# Patient Record
Sex: Female | Born: 1985 | Race: Black or African American | Hispanic: No | Marital: Single | Smoking: Current some day smoker
Health system: Southern US, Community
[De-identification: ages and names within clinical notes are randomized; demographics above are authoritative.]

## PROBLEM LIST (undated history)

## (undated) DIAGNOSIS — E079 Disorder of thyroid, unspecified: Secondary | ICD-10-CM

## (undated) HISTORY — PX: INTRAUTERINE DEVICE INSERTION: SHX323

---
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
## 2003-02-28 ENCOUNTER — Emergency Department (HOSPITAL_COMMUNITY): Admission: EM | Admit: 2003-02-28 | Discharge: 2003-02-28 | Payer: Self-pay

## 2003-04-17 ENCOUNTER — Other Ambulatory Visit: Admission: RE | Admit: 2003-04-17 | Discharge: 2003-04-17 | Payer: Self-pay | Admitting: Family Medicine

## 2006-07-02 ENCOUNTER — Encounter (INDEPENDENT_AMBULATORY_CARE_PROVIDER_SITE_OTHER): Payer: Self-pay | Admitting: *Deleted

## 2007-04-19 ENCOUNTER — Emergency Department (HOSPITAL_COMMUNITY): Admission: EM | Admit: 2007-04-19 | Discharge: 2007-04-19 | Payer: Self-pay | Admitting: *Deleted

## 2007-04-21 ENCOUNTER — Inpatient Hospital Stay (HOSPITAL_COMMUNITY): Admission: AD | Admit: 2007-04-21 | Discharge: 2007-04-21 | Payer: Self-pay | Admitting: Obstetrics & Gynecology

## 2007-06-06 ENCOUNTER — Inpatient Hospital Stay (HOSPITAL_COMMUNITY): Admission: AD | Admit: 2007-06-06 | Discharge: 2007-06-06 | Payer: Self-pay | Admitting: Obstetrics and Gynecology

## 2007-07-12 ENCOUNTER — Ambulatory Visit (HOSPITAL_COMMUNITY): Admission: RE | Admit: 2007-07-12 | Discharge: 2007-07-12 | Payer: Self-pay | Admitting: Obstetrics & Gynecology

## 2007-12-07 ENCOUNTER — Ambulatory Visit (HOSPITAL_COMMUNITY): Admission: RE | Admit: 2007-12-07 | Discharge: 2007-12-07 | Payer: Self-pay | Admitting: Family Medicine

## 2007-12-13 ENCOUNTER — Inpatient Hospital Stay (HOSPITAL_COMMUNITY): Admission: AD | Admit: 2007-12-13 | Discharge: 2007-12-13 | Payer: Self-pay | Admitting: Obstetrics & Gynecology

## 2007-12-13 ENCOUNTER — Ambulatory Visit: Payer: Self-pay | Admitting: Obstetrics & Gynecology

## 2007-12-14 ENCOUNTER — Ambulatory Visit: Payer: Self-pay | Admitting: Family Medicine

## 2007-12-14 ENCOUNTER — Inpatient Hospital Stay (HOSPITAL_COMMUNITY): Admission: AD | Admit: 2007-12-14 | Discharge: 2007-12-16 | Payer: Self-pay | Admitting: Gynecology

## 2007-12-14 ENCOUNTER — Inpatient Hospital Stay (HOSPITAL_COMMUNITY): Admission: AD | Admit: 2007-12-14 | Discharge: 2007-12-14 | Payer: Self-pay | Admitting: Family Medicine

## 2009-05-21 ENCOUNTER — Emergency Department (HOSPITAL_COMMUNITY): Admission: EM | Admit: 2009-05-21 | Discharge: 2009-05-21 | Payer: Self-pay | Admitting: Emergency Medicine

## 2009-06-05 ENCOUNTER — Emergency Department (HOSPITAL_COMMUNITY): Admission: EM | Admit: 2009-06-05 | Discharge: 2009-06-05 | Payer: Self-pay | Admitting: Emergency Medicine

## 2009-06-20 ENCOUNTER — Emergency Department (HOSPITAL_COMMUNITY): Admission: EM | Admit: 2009-06-20 | Discharge: 2009-06-20 | Payer: Self-pay | Admitting: Emergency Medicine

## 2009-08-14 ENCOUNTER — Emergency Department (HOSPITAL_COMMUNITY): Admission: EM | Admit: 2009-08-14 | Discharge: 2009-08-14 | Payer: Self-pay | Admitting: Emergency Medicine

## 2010-10-13 ENCOUNTER — Inpatient Hospital Stay (INDEPENDENT_AMBULATORY_CARE_PROVIDER_SITE_OTHER)
Admission: RE | Admit: 2010-10-13 | Discharge: 2010-10-13 | Disposition: A | Discharge status: Home / Self Care | Payer: Self-pay | Source: Ambulatory Visit | Attending: Emergency Medicine | Admitting: Emergency Medicine

## 2010-10-13 DIAGNOSIS — S058X9A Other injuries of unspecified eye and orbit, initial encounter: Secondary | ICD-10-CM

## 2010-10-13 DIAGNOSIS — H109 Unspecified conjunctivitis: Secondary | ICD-10-CM

## 2010-10-20 ENCOUNTER — Inpatient Hospital Stay (INDEPENDENT_AMBULATORY_CARE_PROVIDER_SITE_OTHER)
Admission: RE | Admit: 2010-10-20 | Discharge: 2010-10-20 | Disposition: A | Discharge status: Home / Self Care | Payer: Self-pay | Source: Ambulatory Visit | Attending: Family Medicine | Admitting: Family Medicine

## 2010-10-20 DIAGNOSIS — H109 Unspecified conjunctivitis: Secondary | ICD-10-CM

## 2011-01-23 LAB — URINALYSIS, ROUTINE W REFLEX MICROSCOPIC
Bilirubin Urine: NEGATIVE
Glucose, UA: NEGATIVE
Hgb urine dipstick: NEGATIVE
Ketones, ur: NEGATIVE
Nitrite: NEGATIVE
Urobilinogen, UA: 0.2
pH: 5.5

## 2011-01-23 LAB — POCT PREGNANCY, URINE
Operator id: 280921
Preg Test, Ur: POSITIVE

## 2011-01-30 LAB — WET PREP, GENITAL
Clue Cells Wet Prep HPF POC: NONE SEEN
Trich, Wet Prep: NONE SEEN
Yeast Wet Prep HPF POC: NONE SEEN

## 2011-02-06 LAB — WET PREP, GENITAL
WBC, Wet Prep HPF POC: NONE SEEN
Yeast Wet Prep HPF POC: NONE SEEN

## 2011-02-06 LAB — URINALYSIS, ROUTINE W REFLEX MICROSCOPIC
Bilirubin Urine: NEGATIVE
Glucose, UA: NEGATIVE
Glucose, UA: NEGATIVE
Hgb urine dipstick: NEGATIVE
Ketones, ur: NEGATIVE
Protein, ur: NEGATIVE
Specific Gravity, Urine: 1.02
Urobilinogen, UA: 2 — ABNORMAL HIGH

## 2011-02-06 LAB — HCG, QUANTITATIVE, PREGNANCY: hCG, Beta Chain, Quant, S: 29779 — ABNORMAL HIGH

## 2011-04-18 ENCOUNTER — Encounter: Payer: Self-pay | Admitting: *Deleted

## 2011-04-18 ENCOUNTER — Emergency Department (INDEPENDENT_AMBULATORY_CARE_PROVIDER_SITE_OTHER)
Admission: EM | Admit: 2011-04-18 | Discharge: 2011-04-18 | Disposition: A | Discharge status: Home / Self Care | Payer: Self-pay | Source: Home / Self Care | Attending: Family Medicine | Admitting: Family Medicine

## 2011-04-18 DIAGNOSIS — J019 Acute sinusitis, unspecified: Secondary | ICD-10-CM

## 2011-04-18 DIAGNOSIS — H05019 Cellulitis of unspecified orbit: Secondary | ICD-10-CM

## 2011-04-18 DIAGNOSIS — H05012 Cellulitis of left orbit: Secondary | ICD-10-CM

## 2011-04-18 MED ORDER — LIDOCAINE HCL (PF) 1 % IJ SOLN
INTRAMUSCULAR | Status: AC
  Filled 2011-04-18: qty 5

## 2011-04-18 MED ORDER — FEXOFENADINE-PSEUDOEPHED ER 180-240 MG PO TB24: 1.0000 | ORAL_TABLET | Freq: Every day | ORAL | Status: AC

## 2011-04-18 MED ORDER — AMOXICILLIN-POT CLAVULANATE 875-125 MG PO TABS: 1.0000 | ORAL_TABLET | Freq: Two times a day (BID) | ORAL | Status: AC

## 2011-04-18 MED ORDER — CEFTRIAXONE SODIUM 1 G IJ SOLR
INTRAMUSCULAR | Status: AC
  Filled 2011-04-18: qty 10

## 2011-04-18 MED ORDER — CEFTRIAXONE SODIUM 1 G IJ SOLR
1.0000 g | Freq: Once | INTRAMUSCULAR | Status: AC
  Administered 2011-04-18: 1 g via INTRAMUSCULAR

## 2011-04-18 NOTE — ED Notes (Addendum)
Pt with onset of Left eye irritated and itching onset upon awakening this morning - left facial swelling and headache onset x several hours face tender to palpation

## 2011-04-18 NOTE — ED Provider Notes (Signed)
History     CSN: [id number] Arrival date & time: 04/18/2011  4:46 PM   First MD Initiated Contact with Patient 04/18/11 1544      Chief Complaint  Patient presents with  . Headache  . Eye Problem  . Facial Swelling   Patient reports swelling of the left thigh irritation this morning since then she's no swelling underneath her left eye and on the side of her face. Noticing that she's never had anything like this before he denies any foreign object or sensation of foreign object in her left eye  (Consider location/radiation/quality/duration/timing/severity/associated sxs/prior treatment) Patient is a 25 y.o. female presenting with headaches and eye problem. The history is provided by the patient.  Headache The primary symptoms include headaches and altered mental status. Primary symptoms do not include loss of consciousness, fever or vomiting. The symptoms began 6 to 12 hours ago. The symptoms are worsening.  The headache is not associated with photophobia.  Additional symptoms do not include photophobia.  Eye Problem  The current episode started 6 to 12 hours ago. There is pain in the left eye. Associated symptoms include numbness, eye redness and tingling. Pertinent negatives include no discharge, no foreign body sensation, no photophobia and no vomiting. She has tried nothing for the symptoms.    History reviewed. No pertinent past medical history.  Past Surgical History  Procedure Date  . Intrauterine device insertion     History reviewed. No pertinent family history.  History  Substance Use Topics  . Smoking status: Current Everyday Smoker  . Smokeless tobacco: Not on file  . Alcohol Use: Yes    OB History    Grav Para Term Preterm Abortions TAB SAB Ect Mult Living                  Review of Systems  Constitutional: Negative for fever.  Eyes: Positive for redness. Negative for photophobia and discharge.  Gastrointestinal: Negative for vomiting.  Neurological:  Positive for tingling, numbness and headaches. Negative for loss of consciousness.  Psychiatric/Behavioral: Positive for altered mental status.  All other systems reviewed and are negative.    Allergies  Review of patient's allergies indicates no known allergies.  Home Medications  No current outpatient prescriptions on file.  BP 113/75  Pulse 74  Temp(Src) 99.2 F (37.3 C) (Oral)  Resp 16  SpO2 100%  LMP 03/19/2011  Physical Exam  Constitutional: She is oriented to person, place, and time. She appears well-nourished.  HENT:  Head: Normocephalic.  Right Ear: External ear normal.  Left Ear: External ear normal.       Tenderness over both maxillary sinuses  Swelling under the L eye Mild conjunctivae irritation  Neck: Normal range of motion. Neck supple. No tracheal deviation present. No thyromegaly present.  Neurological: She is alert and oriented to person, place, and time.  Skin: Skin is warm.    ED Course  Procedures (including critical care time)  Labs Reviewed - No data to display No results found.   No diagnosis found.    MDM          Hassan Rowan, MD 04/18/11 2136

## 2012-08-20 ENCOUNTER — Emergency Department (HOSPITAL_COMMUNITY)
Admission: EM | Admit: 2012-08-20 | Discharge: 2012-08-20 | Disposition: A | Discharge status: Home / Self Care | Payer: Self-pay | Attending: Emergency Medicine | Admitting: Emergency Medicine

## 2012-08-20 ENCOUNTER — Encounter (HOSPITAL_COMMUNITY): Payer: Self-pay

## 2012-08-20 DIAGNOSIS — F17200 Nicotine dependence, unspecified, uncomplicated: Secondary | ICD-10-CM | POA: Insufficient documentation

## 2012-08-20 DIAGNOSIS — R109 Unspecified abdominal pain: Secondary | ICD-10-CM | POA: Insufficient documentation

## 2012-08-20 DIAGNOSIS — R1110 Vomiting, unspecified: Secondary | ICD-10-CM | POA: Insufficient documentation

## 2012-08-20 DIAGNOSIS — R32 Unspecified urinary incontinence: Secondary | ICD-10-CM | POA: Insufficient documentation

## 2012-08-20 DIAGNOSIS — R350 Frequency of micturition: Secondary | ICD-10-CM | POA: Insufficient documentation

## 2012-08-20 DIAGNOSIS — R6883 Chills (without fever): Secondary | ICD-10-CM | POA: Insufficient documentation

## 2012-08-20 HISTORY — DX: Disorder of thyroid, unspecified: E07.9

## 2012-08-20 LAB — COMPREHENSIVE METABOLIC PANEL
ALT: 10 U/L (ref 0–35)
AST: 16 U/L (ref 0–37)
Albumin: 4.1 g/dL (ref 3.5–5.2)
CO2: 25 mEq/L (ref 19–32)
Calcium: 9.7 mg/dL (ref 8.4–10.5)
Creatinine, Ser: 0.68 mg/dL (ref 0.50–1.10)
GFR calc non Af Amer: >90 mL/min (ref >90)
Sodium: 139 mEq/L (ref 135–145)
Total Protein: 7.6 g/dL (ref 6.0–8.3)

## 2012-08-20 LAB — URINALYSIS, ROUTINE W REFLEX MICROSCOPIC
Glucose, UA: NEGATIVE mg/dL
Hgb urine dipstick: NEGATIVE
Leukocytes, UA: NEGATIVE
Specific Gravity, Urine: 1.029 (ref 1.005–1.030)
pH: 6.5 (ref 5.0–8.0)

## 2012-08-20 LAB — CBC WITH DIFFERENTIAL/PLATELET
Basophils Absolute: 0 10*3/uL (ref 0.0–0.1)
Basophils Relative: 0 % (ref 0–1)
Eosinophils Relative: 0 % (ref 0–5)
Lymphocytes Relative: 6 % — ABNORMAL LOW (ref 12–46)
MCHC: 33.8 g/dL (ref 30.0–36.0)
MCV: 81.3 fL (ref 78.0–100.0)
Platelets: 236 10*3/uL (ref 150–400)
RDW: 14 % (ref 11.5–15.5)
WBC: 17.8 10*3/uL — ABNORMAL HIGH (ref 4.0–10.5)

## 2012-08-20 MED ORDER — SODIUM CHLORIDE 0.9 % IV BOLUS (SEPSIS)
1000.0000 mL | Freq: Once | INTRAVENOUS | Status: AC
  Administered 2012-08-20: 1000 mL via INTRAVENOUS

## 2012-08-20 MED ORDER — MORPHINE SULFATE 4 MG/ML IJ SOLN
4.0000 mg | Freq: Once | INTRAMUSCULAR | Status: AC
  Administered 2012-08-20: 4 mg via INTRAVENOUS
  Filled 2012-08-20: qty 1

## 2012-08-20 MED ORDER — ONDANSETRON HCL 4 MG/2ML IJ SOLN
4.0000 mg | Freq: Once | INTRAMUSCULAR | Status: AC
  Administered 2012-08-20: 4 mg via INTRAVENOUS
  Filled 2012-08-20: qty 2

## 2012-08-20 MED ORDER — ONDANSETRON HCL 4 MG PO TABS: 4.0000 mg | ORAL_TABLET | Freq: Four times a day (QID) | ORAL | Status: DC

## 2012-08-20 NOTE — ED Notes (Signed)
Pt states she is unable to retain her urine. Vomited x 2-3 per hour since 11am this morning. Also complains of abdominal pain and right side back pain. Pt also complains of chills. No diarrhea.

## 2012-08-20 NOTE — ED Provider Notes (Addendum)
History     CSN: [id number]  Arrival date & time 08/20/12  1851   First MD Initiated Contact with Patient 08/20/12 1858      Chief Complaint  Patient presents with  . Emesis  . Urinary Incontinence    (Consider location/radiation/quality/duration/timing/severity/associated sxs/prior treatment) Patient is a 27 y.o. female presenting with vomiting. The history is provided by the patient.  Emesis Severity:  Severe Duration:  9 hours Timing:  Constant Number of daily episodes:  2-3 episodes of vomiting per hour Quality:  Stomach contents Progression:  Unchanged Chronicity:  New Urine output: states is incontinent while vomiting but no dysuria. Relieved by:  Nothing Exacerbated by: eating. 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 No pertinent past medical history.  Past Surgical History  Procedure Laterality Date  . Intrauterine device insertion      No family history on file.  History  Substance Use Topics  . Smoking status: Current Every Day Smoker  . Smokeless tobacco: Not on file  . Alcohol Use: Yes    OB History   Grav Para Term Preterm Abortions TAB SAB Ect Mult Living                  Review of Systems  Constitutional: Positive for chills.  Gastrointestinal: Positive for vomiting and abdominal pain. Negative for diarrhea.  Genitourinary: Positive for frequency. Negative for dysuria.  Neurological: Negative for headaches.  All other systems reviewed and are negative.    Allergies  Review of patient's allergies indicates no known allergies.  Home Medications   Current Outpatient Rx  Name  Route  Sig  Dispense  Refill  . Camphor-Eucalyptus-Menthol (VICKS  VAPORUB EX)   Apply externally   Apply 1 application topically daily as needed (for feet).           There were no vitals taken for this visit.  Physical Exam  Nursing note and vitals reviewed. Constitutional: She is oriented to person, place, and time. She appears well-developed and well-nourished. She appears distressed.  HENT:  Head: Normocephalic and atraumatic.  Mouth/Throat: Oropharynx is clear and moist.  Eyes: Conjunctivae and EOM are normal. Pupils are equal, round, and reactive to light.  Neck: Normal range of motion. Neck supple.  Cardiovascular: Regular rhythm and intact distal pulses.  Tachycardia present.   No murmur heard. Pulmonary/Chest: Effort normal and breath sounds normal. No respiratory distress. She has no wheezes. She has no rales.  Abdominal: Soft. Normal appearance. She exhibits no distension. There is generalized tenderness. There is CVA tenderness. There is no rebound and no guarding.  Mild right CVA tenderness  Musculoskeletal: Normal range of motion. She exhibits no edema and no tenderness.  Neurological: She is alert and oriented to person, place, and time.  Skin: Skin is warm and dry. No rash noted. No erythema.  Psychiatric: She has a normal mood and affect. Her behavior is normal.    ED Course  Procedures (including critical care time)  Labs Reviewed  CBC WITH DIFFERENTIAL - Abnormal; Notable for the following:    WBC 17.8 (*)    Neutrophils Relative 92 (*)    Neutro Abs 16.3 (*)    Lymphocytes Relative 6 (*)  All other components within normal limits  COMPREHENSIVE METABOLIC PANEL - Abnormal; Notable for the following:    Glucose, Bld 114 (*)    All other components within normal limits  LIPASE, BLOOD  URINALYSIS, ROUTINE W REFLEX MICROSCOPIC   No results found.   1. Vomiting       MDM   Patient here complaining of vomiting since 11 AM this morning. She's complaining of diffuse abdominal pain and mild right flank pain. Also  states with episodes of vomiting she is having urinary incontinence but denies any dysuria or foul-smelling her urine. She does not know she's been running a fever and denies any diarrhea. 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  Will give pt antiemetic and pain control.  Currently can't lay on the bed to preform abd exam.  Will give meds and will re-eval.  Concern for viral etiology vs pancreatitis vs cholecystitis vs pyelo however due to abrupt nature feel pyelo is unlikely.  8:38 PM Labs are within normal limits except for an elevated white blood cell count of 17,000. On reevaluation patient only has epigastric pain on exam. There is no right upper quadrant pain to suggest cholecystitis or left upper quadrant pain and lipase is normal. She has no suprapubic pain however her urine is pending. He'll most likely this is viral in origin. After one dose of morphine and Zofran she is feeling much better and wants to by mouth challenge.  Based on abd exam do not feel pt needs a CT at this time.  9:32 PM Pt tolerating po's and feeling much better.  Will d/c home.    Gwyneth Sprout, MD 08/20/12 7846  Gwyneth Sprout, MD 08/20/12 2133

## 2012-08-20 NOTE — ED Notes (Signed)
Bed:WA11<BR> Expected date:<BR> Expected time:<BR> Means of arrival:<BR> Comments:<BR>

## 2013-06-24 ENCOUNTER — Emergency Department (INDEPENDENT_AMBULATORY_CARE_PROVIDER_SITE_OTHER): Payer: Self-pay

## 2013-06-24 ENCOUNTER — Encounter (HOSPITAL_COMMUNITY): Payer: Self-pay | Admitting: Emergency Medicine

## 2013-06-24 ENCOUNTER — Emergency Department (INDEPENDENT_AMBULATORY_CARE_PROVIDER_SITE_OTHER)
Admission: EM | Admit: 2013-06-24 | Discharge: 2013-06-24 | Disposition: A | Discharge status: Hospice | Payer: Self-pay | Source: Home / Self Care

## 2013-06-24 ENCOUNTER — Emergency Department (HOSPITAL_COMMUNITY): Payer: No Typology Code available for payment source

## 2013-06-24 ENCOUNTER — Emergency Department (HOSPITAL_COMMUNITY)
Admission: EM | Admit: 2013-06-24 | Discharge: 2013-06-24 | Disposition: A | Discharge status: Home / Self Care | Payer: Self-pay | Attending: Emergency Medicine | Admitting: Emergency Medicine

## 2013-06-24 DIAGNOSIS — R29818 Other symptoms and signs involving the nervous system: Secondary | ICD-10-CM

## 2013-06-24 DIAGNOSIS — M25519 Pain in unspecified shoulder: Secondary | ICD-10-CM

## 2013-06-24 DIAGNOSIS — M542 Cervicalgia: Secondary | ICD-10-CM

## 2013-06-24 DIAGNOSIS — M25511 Pain in right shoulder: Secondary | ICD-10-CM

## 2013-06-24 DIAGNOSIS — S199XXA Unspecified injury of neck, initial encounter: Secondary | ICD-10-CM

## 2013-06-24 DIAGNOSIS — Z043 Encounter for examination and observation following other accident: Secondary | ICD-10-CM

## 2013-06-24 DIAGNOSIS — S4980XA Other specified injuries of shoulder and upper arm, unspecified arm, initial encounter: Secondary | ICD-10-CM | POA: Insufficient documentation

## 2013-06-24 DIAGNOSIS — R410 Disorientation, unspecified: Secondary | ICD-10-CM

## 2013-06-24 DIAGNOSIS — Z041 Encounter for examination and observation following transport accident: Secondary | ICD-10-CM

## 2013-06-24 DIAGNOSIS — Z862 Personal history of diseases of the blood and blood-forming organs and certain disorders involving the immune mechanism: Secondary | ICD-10-CM | POA: Insufficient documentation

## 2013-06-24 DIAGNOSIS — S46909A Unspecified injury of unspecified muscle, fascia and tendon at shoulder and upper arm level, unspecified arm, initial encounter: Secondary | ICD-10-CM | POA: Insufficient documentation

## 2013-06-24 DIAGNOSIS — S0993XA Unspecified injury of face, initial encounter: Secondary | ICD-10-CM | POA: Insufficient documentation

## 2013-06-24 DIAGNOSIS — S060X9A Concussion with loss of consciousness of unspecified duration, initial encounter: Secondary | ICD-10-CM | POA: Insufficient documentation

## 2013-06-24 DIAGNOSIS — F29 Unspecified psychosis not due to a substance or known physiological condition: Secondary | ICD-10-CM

## 2013-06-24 DIAGNOSIS — R55 Syncope and collapse: Secondary | ICD-10-CM

## 2013-06-24 DIAGNOSIS — Y92410 Unspecified street and highway as the place of occurrence of the external cause: Secondary | ICD-10-CM | POA: Insufficient documentation

## 2013-06-24 DIAGNOSIS — Z8639 Personal history of other endocrine, nutritional and metabolic disease: Secondary | ICD-10-CM | POA: Insufficient documentation

## 2013-06-24 DIAGNOSIS — Z91040 Latex allergy status: Secondary | ICD-10-CM | POA: Insufficient documentation

## 2013-06-24 DIAGNOSIS — F17200 Nicotine dependence, unspecified, uncomplicated: Secondary | ICD-10-CM | POA: Insufficient documentation

## 2013-06-24 DIAGNOSIS — IMO0002 Reserved for concepts with insufficient information to code with codable children: Secondary | ICD-10-CM | POA: Insufficient documentation

## 2013-06-24 DIAGNOSIS — R2990 Unspecified symptoms and signs involving the nervous system: Secondary | ICD-10-CM

## 2013-06-24 DIAGNOSIS — Y9389 Activity, other specified: Secondary | ICD-10-CM | POA: Insufficient documentation

## 2013-06-24 MED ORDER — HYDROCODONE-ACETAMINOPHEN 5-325 MG PO TABS: 1.0000 | ORAL_TABLET | Freq: Four times a day (QID) | ORAL | Status: DC | PRN

## 2013-06-24 MED ORDER — OXYCODONE-ACETAMINOPHEN 5-325 MG PO TABS
2.0000 | ORAL_TABLET | Freq: Once | ORAL | Status: AC
  Administered 2013-06-24: 2 via ORAL
  Filled 2013-06-24: qty 2
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
## 2013-06-24 NOTE — ED Provider Notes (Signed)
Medical screening examination/treatment/procedure(s) were performed by non-physician practitioner and as supervising physician I was immediately available for consultation/collaboration.  Roxie Gueye, M.D.  Sylvanus Telford C Kiarra Kidd, MD 06/24/13 2136 
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
## 2013-06-24 NOTE — Discharge Instructions (Signed)
Your CT head and cervical spine today were negative for any acute or traumatic findings. Recommend that you refrain from strenuous activity, heavy lifting, and contact sports. Do not drive for 48 hours. Followup with your doctor in one week to clear you from concussion precautions. Also recommend rest, ice, and Norco as needed for pain control for your right shoulder strain. Followup with orthopedic surgeon if symptoms do not improve with recommend treatment. Return to the emergency department if symptoms worsen.  Concussion, Adult A concussion is a brain injury. It is caused by:  A hit to the head.  A quick and sudden movement (jolt) of the head or neck. A concussion is usually not life-threatening. Even so, it can cause serious problems. If you had a concussion before, you may have concussion-like problems after a hit to your head. HOME CARE General Instructions  Follow your doctor's directions carefully.  Take medicines only as told by your doctor.  Only take medicines your doctor says are safe.  Do not drink alcohol until your doctor says it is OK. Alcohol and some drugs can slow down healing. They can also put you at risk for further injury.  If your are having trouble remembering things, write them down.  Try to do one thing at a time if you get distracted easily. For example, do not watch TV while making dinner.  Talk to your family members or close friends when making important decisions.  Follow up with your doctor as told.  Watch your symptoms. Tell others to do the same. Serious problems can sometimes happen after a concussion. Older adults are more likely to have these problems.  Tell your teachers, school nurse, school counselor, coach, Event organiser, or work Production designer, theatre/television/film about your concussion. Tell them about what you can or cannot do. They should watch to see if:  It gets even harder for you to pay attention or concentrate.  It gets even harder for you to remember  things or learn new things.  You need more time than normal to finish things.  You become annoyed (irritable) more than before.  You are not able to deal with stress as well.  You have more problems than before.  Rest. Make sure you:  Get plenty of sleep at night.  Go to sleep early.  Go to bed at the same time every day. Try to wake up at the same time.  Rest during the day.  Take naps when you feel tired.  Limit activities where you have to think a lot or concentrate. These include:  Doing homework.  Doing work related to a job.  Watching TV.  Using the computer. Returning To Your Regular Activities Return to your normal activities slowly, not all at once. You must give your body and brain enough time to heal.   Do not play sports or do other athletic activities until your doctor says it is OK.  Ask your doctor when you can drive, ride a bicycle, or work other vehicles or machines. Never do these things if you feel dizzy.  Ask your doctor about when you can return to work or school. Preventing Another Concussion It is very important to avoid another brain injury, especially before you have healed. In rare cases, another injury can lead to permanent brain damage, brain swelling, or death. The risk of this is greatest during the first 7 10 after your injury. Avoid injuries by:   Wearing a seat belt when riding in a car.  Not  drinking too much alcohol.  Avoiding activities that could lead to a second concussion (such as contact sports).  Wearing a helmet when doing activities like:  Biking.  Skiing.  Skateboarding.  Skating.  Making your home safer by:  Removing things from the floor or stairways that could make you trip.  Using grab bars in bathrooms and handrails by stairs.  Placing non-slip mats on floors and in bathtubs.  Improve lighting in dark areas. GET HELP IF:  It gets even harder for you to pay attention or concentrate.  It gets even  harder for you to remember things or learn new things.  You need more time than normal to finish things.  You become annoyed (irritable) more than before.  You are not able to deal with stress as well.  You have more problems than before.  You have problems keeping your balance.  You are not able to react quickly when you should. Get help if you have any of these problems for more than 2 weeks:   Lasting (chronic) headaches.  Dizziness or trouble balancing.  Feeling sick to your stomach (nausea).  Seeing (vision) problems.  Being affected by noises or light more than normal.  Feeling sad, low, down in the dumps, blue, gloomy, or empty (depressed).  Mood changes (mood swings).  Feeling of fear or nervousness about what may happen (anxiety).  Feeling annoyed.  Memory problems.  Problems concentrating or paying attention.  Sleep problems.  Feeling tired all the time. GET HELP RIGHT AWAY IF:   You have bad headaches or your headaches get worse.  You have weakness (even if it is in one hand, leg, or part of the face).  You have loss of feeling (numbness).  You feel off balance.  You keep throwing up (vomiting).  You feel tired.  One black center of your eye (pupil) is larger than the other.  You twitch or shake violently (convulse).  Your speech is not clear (slurred).  You are more confused, easily angered (agitated), or annoyed than before.  You have more trouble resting than before.  You are unable to recognize people or places.  You have neck pain.  It is difficult to wake you up.  You have unusual behavior changes.  You pass out (lose consciousness). MAKE SURE YOU:   Understand these instructions.  Will watch your condition.  Will get help right away if you are not doing well or get worse. Document Released: 04/08/2009 Document Revised: 02/08/2013 Document Reviewed: 11/10/2012 [facility] Patient Information 2014 [place],  [place].  Shoulder Pain The shoulder is the joint that connects your arms to your body. The bones that form the shoulder joint include the upper arm bone (humerus), the shoulder blade (scapula), and the collarbone (clavicle). The top of the humerus is shaped like a ball and fits into a rather flat socket on the scapula (glenoid cavity). A combination of muscles and strong, fibrous tissues that connect muscles to bones (tendons) support your shoulder joint and hold the ball in the socket. Small, fluid-filled sacs (bursae) are located in different areas of the joint. They act as cushions between the bones and the overlying soft tissues and help reduce friction between the gliding tendons and the bone as you move your arm. Your shoulder joint allows a wide range of motion in your arm. This range of motion allows you to do things like scratch your back or throw a ball. However, this range of motion also makes your shoulder more prone  to pain from overuse and injury. Causes of shoulder pain can originate from both injury and overuse and usually can be grouped in the following four categories:  Redness, swelling, and pain (inflammation) of the tendon (tendinitis) or the bursae (bursitis).  Instability, such as a dislocation of the joint.  Inflammation of the joint (arthritis).  Broken bone (fracture). HOME CARE INSTRUCTIONS   Apply ice to the sore area.  Put ice in a plastic bag.  Place a towel between your skin and the bag.  Leave the ice on for 15-20 minutes, 03-04 times per day for the first 2 days.  Stop using cold packs if they do not help with the pain.  If you have a shoulder sling or immobilizer, wear it as long as your caregiver instructs. Only remove it to shower or bathe. Move your arm as little as possible, but keep your hand moving to prevent swelling.  Squeeze a soft ball or foam pad as much as possible to help prevent swelling.  Only take over-the-counter or prescription medicines  for pain, discomfort, or fever as directed by your caregiver. SEEK MEDICAL CARE IF:   Your shoulder pain increases, or new pain develops in your arm, hand, or fingers.  Your hand or fingers become cold and numb.  Your pain is not relieved with medicines. SEEK IMMEDIATE MEDICAL CARE IF:   Your arm, hand, or fingers are numb or tingling.  Your arm, hand, or fingers are significantly swollen or turn white or blue. MAKE SURE YOU:   Understand these instructions.  Will watch your condition.  Will get help right away if you are not doing well or get worse. Document Released: 01/28/2005 Document Revised: 01/13/2012 Document Reviewed: 04/04/2011 [facility] Patient Information 2014 [place]. RICE: Routine Care for Injuries Rest, Ice, Compression, and Elevation (RICE) are often used to care for injuries. HOME CARE  Rest your injury.  Put ice on the injury.  Put ice in a plastic bag.  Place a towel between your skin and the bag.  Leave the ice on for 15-20 minutes, 03-04 times a day. Do this for as long as told by your doctor.  Apply pressure (compression) with an elastic bandage. Remove and reapply the bandage every 3 to 4 hours. Do not wrap the bandage too tight. Wrap the bandage looser if the fingers or toes are puffy (swollen), blue, cold, painful, or lose feeling (numb).  Raise (elevate) your injury. Raise your injury above the heart if you can. GET HELP RIGHT AWAY IF:  You have lasting pain or puffiness.  Your injury is red, weak, or loses feeling.  Your problems get worse, not better, after several days. MAKE SURE YOU:  Understand these instructions.  Will watch your condition.  Will get help right away if you are not doing well or get worse. Document Released: 10/07/2007 Document Revised: 07/13/2011 Document Reviewed: 09/19/2010 [facility] Patient Information 2014 [place], [place].

## 2013-06-24 NOTE — Progress Notes (Signed)
Orthopedic Tech Progress Note Patient Details:  Kathleen Mathews 28-Oct-1985 [id number]  Ortho Devices Type of Ortho Device: Shoulder immobilizer Ortho Device/Splint Interventions: Application   Cammer, Mickie BailJennifer Carol 06/24/2013, 2:47 PM

## 2013-06-24 NOTE — ED Provider Notes (Signed)
Medical screening examination/treatment/procedure(s) were performed by non-physician practitioner and as supervising physician I was immediately available for consultation/collaboration.  EKG Interpretation   None         Shanna CiscoMegan E Docherty, MD 06/24/13 2059
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
## 2014-10-14 ENCOUNTER — Emergency Department (HOSPITAL_COMMUNITY)
Admission: EM | Admit: 2014-10-14 | Discharge: 2014-10-14 | Disposition: A | Discharge status: Home / Self Care | Payer: Self-pay | Attending: Emergency Medicine | Admitting: Emergency Medicine

## 2014-10-14 ENCOUNTER — Emergency Department (HOSPITAL_COMMUNITY): Payer: Self-pay

## 2014-10-14 ENCOUNTER — Encounter (HOSPITAL_COMMUNITY): Payer: Self-pay

## 2014-10-14 DIAGNOSIS — S01511A Laceration without foreign body of lip, initial encounter: Secondary | ICD-10-CM | POA: Insufficient documentation

## 2014-10-14 DIAGNOSIS — Y9389 Activity, other specified: Secondary | ICD-10-CM | POA: Insufficient documentation

## 2014-10-14 DIAGNOSIS — Y998 Other external cause status: Secondary | ICD-10-CM | POA: Insufficient documentation

## 2014-10-14 DIAGNOSIS — Z8639 Personal history of other endocrine, nutritional and metabolic disease: Secondary | ICD-10-CM | POA: Insufficient documentation

## 2014-10-14 DIAGNOSIS — Y9289 Other specified places as the place of occurrence of the external cause: Secondary | ICD-10-CM | POA: Insufficient documentation

## 2014-10-14 DIAGNOSIS — S032XXA Dislocation of tooth, initial encounter: Secondary | ICD-10-CM | POA: Insufficient documentation

## 2014-10-14 DIAGNOSIS — Z720 Tobacco use: Secondary | ICD-10-CM | POA: Insufficient documentation

## 2014-10-14 DIAGNOSIS — Z91040 Latex allergy status: Secondary | ICD-10-CM | POA: Insufficient documentation

## 2014-10-14 MED ORDER — DOXYCYCLINE HYCLATE 100 MG PO TABS
100.0000 mg | ORAL_TABLET | Freq: Once | ORAL | Status: AC
  Administered 2014-10-14: 100 mg via ORAL
  Filled 2014-10-14: qty 1

## 2014-10-14 MED ORDER — BUPIVACAINE-EPINEPHRINE (PF) 0.5% -1:200000 IJ SOLN
1.8000 mL | Freq: Once | INTRAMUSCULAR | Status: AC
  Administered 2014-10-14: 1.8 mL
  Filled 2014-10-14: qty 1.8

## 2014-10-14 MED ORDER — DOXYCYCLINE HYCLATE 100 MG PO TABS: 100.0000 mg | ORAL_TABLET | Freq: Two times a day (BID) | ORAL | Status: DC

## 2014-10-14 MED ORDER — NAPROXEN 500 MG PO TABS
500.0000 mg | ORAL_TABLET | Freq: Once | ORAL | Status: AC
  Administered 2014-10-14: 500 mg via ORAL
  Filled 2014-10-14: qty 1

## 2014-10-14 MED ORDER — OXYCODONE-ACETAMINOPHEN 5-325 MG PO TABS: 1.0000 | ORAL_TABLET | ORAL | Status: DC | PRN

## 2014-10-14 NOTE — ED Notes (Signed)
Patient transported to CT 
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
## 2014-10-14 NOTE — Discharge Instructions (Signed)
] Please read the information below carefully. You will need to be on a liquid diet. He may use warm salt water to flush her mouth daily. Please take the antibiotic as indicated. Please call Dr. Deirdre Peer office first thing in the morning to schedule a follow-up appointment for your tooth fractures. Do not eat or drink anything after midnight. Dental Fracture You have a dental fracture or injury. This can mean the tooth is loose, has a chip in the enamel or is broken. If just the outer enamel is chipped, there is a good chance the tooth will not become infected. The only treatment needed may be to smooth off a rough edge. Fractures into the deeper layers (dentin and pulp) cause greater pain and are more likely to become infected. These require you to see a dentist as soon as possible to save the tooth. Loose teeth may need to be wired or bonded with a plastic splint to hold them in place. A paste may be painted on the open area of the broken tooth to reduce the pain. Antibiotics and pain medicine may be prescribed. Choosing a soft or liquid diet and rinsing the mouth out with warm water after meals may be helpful. See your dentist as recommended. Failure to seek care or follow up with a dentist or other specialist as recommended could result in the loss of your tooth, infection, or permanent dental problems. SEEK MEDICAL CARE IF:   You have increased pain not controlled with medicines.  You have swelling around the tooth, in the face or neck.  You have bleeding which starts, continues, or gets worse.  You have a fever. Document Released: 05/28/2004 Document Revised: 07/13/2011 Document Reviewed: 03/12/2009 [facility] Patient Information 2015 [place], [place]. This information is not intended to replace advice given to you by your health care provider. Make sure you discuss any questions you have with your health care provider.  Tooth Injuries The 3 most common tooth injuries are 1) fracture, 2)  luxation (dislocated), and 3) avulsion (entire tooth comes out). Fracture. A fracture usually splits the tooth into 2 or more parts. Part of the tooth stays attached to the socket and 1 or more pieces of the tooth break free.  When the flesh inside the tooth (pulp) is injured, it can be identified by bleeding at the site or a pink or red dot in the dentin. Dentin is the yellowish second layer usually covered by enamel. Problems involving the dentin may be painful because the junction of the enamel and dentin is very sensitive. Limiting exposure of air, fluid in the mouth (saliva), temperature changes, and the tongue to tooth pulp will decrease the pain.  Fractures may be classified as:  Crown fractures. A crown fracture may involve the enamel only or the enamel and dentin. Sometimes crown fractures are broken down as simple (no pulp involvement) or complex (pulp involvement).  Root fractures. Root fractures almost always involve the pulp.  A combination of both. Luxation. Luxation means the tooth becomes dislocated within the socket but maintains some attachment. There are different types of luxations. They be identified by teeth that appear:  Longer than the surrounding teeth (extruded).  Positioned ahead of or behind the normal tooth row (laterally displaced). With either injury, the tooth should be firmly grasped with a gloved hand and moved into its normal position. If the procedure is too difficult or too painful, the tooth should be left where it is for a dentist to reposition.   Pushed  into the gum and appears shorter than the surrounding teeth (intruded). Do not attempt to reposition an intruded tooth. With all luxated teeth, get to a dentist as soon as possible. Avulsion. Avulsion means the entire tooth is removed from its socket. The best outcomes require putting the tooth back in place within 60 minutes. After 2 hours, the chances of saving the tooth are small but getting to a dentist  right away can be beneficial. Locate and protect the lost tooth. The tooth will often still be in the mouth, but if it cannot be located, check clothing, and the surrounding area. If dirty, the tooth should be gently cleaned with water or salty water (saline). To make saline combine  teaspoon of table salt in a cup of warm water. The tooth should be handled only on its enamel surface. The root should be protected from further injury. If the tooth cannot be repositioned into the socket after cleaning, transport the tooth in saliva, distilled water, or milk to the dentist. Avulsed baby (primary) teeth should not be reimplanted. TREATMENT   Gently biting into gauze or a towel will help control the bleeding. An exposed nerve requires dental exam and care.  Tooth fragments should be handled on their enamel surfaces, saved, and sent to the dental office with the injured person.  Minor fractures, involving only the enamel, usually do not require immediate dental treatment. A tooth can also be loosened by injury with no visible fracture or displacement. A person with this injury should be referred to a dentist for an X-ray exam to look for tooth fractures below the gum line.  Root fractures require joining the fractured tooth to a healthy tooth (splinting) by a dentist as soon as possible. If splinting is not possible, extraction of the remaining tooth may be necessary.  Only take over-the-counter or prescription medicines for pain, discomfort, or fever as directed by your caregiver.  If you are given antibiotics, finish all of them as directed. RISKS AND COMPLICATIONS Complications from tooth injuries can include:  Tooth death.  Tooth loss.  Cosmetic deformity.  Infection. PREVENTION  Mouth guards should be worn in all contact sports. SEEK DENTAL CARE IF:  Pain becomes worse rather than better, or if pain is uncontrolled with medications.  You have increased swelling or redness in your face  near the injured tooth.  You have an oral temperature above 102 F (38.9 C) not controlled by medicine.  You cannot open your mouth. Document Released: 01/16/2004 Document Revised: 07/13/2011 Document Reviewed: 07/25/2009 [facility] Patient Information 2015 [place], [place]. This information is not intended to replace advice given to you by your health care provider. Make sure you discuss any questions you have with your health care provider.  Tooth Displacement  Tooth displacement (luxation) means one of your teeth has been moved out of its normal position but not knocked out. It can happen to a baby tooth or a permanent tooth. Usually, it happens to the teeth in the front of the mouth (incisors). The 3 types of tooth displacement are:   Extrusion. The tooth is pulled up and out of its normal position, and it appears longer than it did in its normal position.  Lateral displacement. The tooth appears to be in front or behind the normal row of teeth.  Intrusion. The tooth appears shorter than its normal position and pushed into the gum. CAUSES   Trauma to the mouth or jaw is the most common cause.  Disease. This is rare.  Tooth displacement can occur as a result of diseases, such as:  Tumors.  Bone infection.  Gum (periodontal) disease. SYMPTOMS   You can see that the tooth position has changed.  The tooth is loose. This does not include baby teeth that are about to come out.  Pain.  Bleeding.  Gums are swollen or bruised.  Facial swelling.  Tooth discoloration. DIAGNOSIS  To diagnose tooth displacement, a dentist will:  Ask about symptoms and history of trauma.  Examine your mouth and teeth. This may include:  Using a bright light to look for any cuts or chipped teeth.  Gently moving the tooth to see how loose it is.  Checking your bite. This is the way your upper and lower teeth come together.  Tapping on the tooth. This checks the health of the gum tissue and  supporting bone around the teeth (periodontium).  Applying something hot or cold on the tooth. This checks the nerve in the tooth to see if it is vital (still connected to a nerve and has a blood supply).  X-ray exams for evaluation of possible fractures. TREATMENT  In general, treatment may include:  Repair of any wounds around the tooth and mouth.  Removal of any chipped pieces of tooth.  An antibiotic medicine.  Pain medicine.  Referral to a dentist as soon as possible. Treatment will also depend on the type of tooth displacement you have and whether your tooth injury involves a baby tooth or a permanent tooth.  Extrusion and lateral displacement.  With extrusion and lateral displacement of baby teeth, extraction is often needed if excessive force is needed to reposition the tooth. This will be determined by your caregiver on an individual basis.  With extrusion and lateral displacement of permanent teeth, the dentist will try to reposition the tooth to its normal position. If this is successful, a splint will be needed for 1 to 2 weeks to keep the tooth in place. If this does not work, orthodontic treatment will be needed to obtain proper alignment.  Intrusion.  For a baby tooth that was pushed into the gum, the dentist may let it grow back on its own or take the tooth out. The baby tooth may be taken out if it could harm the permanent tooth under it.  For a permanent tooth that was pushed into the gum, the dentist may let it grow back out on its own or may need to reposition the tooth orthodontically or surgically. HOME CARE INSTRUCTIONS  Take any medicines that your caregiver and dentist suggest. Follow the directions carefully.  Brush your teeth gently, if possible.  For a few weeks, try not to chew in the area of the displaced tooth.  Check around the tooth daily. Look for redness or swelling. This could be a sign of infection.  Keep all follow-up appointments. This  is how your caregiver can tell if you are recovering like you should. SEEK IMMEDIATE DENTAL CARE IF:  Your pain gets much worse even after taking pain medicine.  The area around the tooth swells.  Your tooth becomes loose or the splint becomes loose.  You have bleeding near the tooth that does not stop in 10 minutes.  You have trouble swallowing or opening your mouth.  Your permanent tooth comes out after repositioning.  You have a fever.  You develop facial swelling. Document Released: 01/06/2011 Document Revised: 09/04/2013 Document Reviewed: 01/06/2011 [facility] Patient Information 2015 [place], [place]. This information is not intended to replace  advice given to you by your health care provider. Make sure you discuss any questions you have with your health care provider. Full Liquid Diet A full liquid diet may be used:   To help you transition from a clear liquid diet to a soft diet.   When your body is healing and can only tolerate foods that are easy to digest.  Before or after certain a procedure, test, or surgery (such as stomach or intestinal surgeries).   If you have trouble swallowing or chewing.  A full liquid diet includes fluids and foods that are liquid or will become liquid at room temperature. The full liquid diet gives you the proteins, fluids, salts, and minerals that you need for energy. If you continue this diet for more than 72 hours, talk to your health care provider about how many calories you need to consume. If you continue the diet for more than 5 days, talk to your health care provider about taking a multivitamin or a nutritional supplement. WHAT DO I NEED TO KNOW ABOUT A FULL LIQUID DIET?  You may have any liquid.  You may have any food that becomes a liquid at room temperature. The food is considered a liquid if it can be poured off a spoon at room temperature.  Drink one serving of citrus or vitamin C-enriched fruit juice daily. WHAT FOODS CAN I  EAT? Grains Any grain food that can be pureed in soup (such as crackers, pasta, and rice). Hot cereal (such as farina or oatmeal) that has been blended. Talk to your health care provider or dietitian about these foods. Vegetables Pulp-free tomato or vegetable juice. Vegetables pureed in soup.  Fruits Fruit juice, including nectars and juices with pulp. Meats and Other Protein Sources Eggs in custard, eggnog mix, and eggs used in ice cream or pudding. Strained meats, like in baby food, may be allowed. Consult your health care provider.  Dairy Milk and milk-based beverages, including milk shakes and instant breakfast mixes. Smooth yogurt. Pureed cottage cheese. Avoid these foods if they are not well tolerated. Beverages All beverages, including liquid nutritional supplements. Ask your health care provider if you can have carbonated beverages. They may not be well tolerated. Condiments Iodized salt, pepper, spices, and flavorings. Cocoa powder. Vinegar, ketchup, yellow mustard, smooth sauces (such as hollandaise, cheese sauce, or white sauce), and soy sauce. Sweets and Desserts Custard, smooth pudding. Flavored gelatin. Tapioca, junket. Plain ice cream, sherbet, fruit ices. Frozen ice pops, frozen fudge pops, pudding pops, and other frozen bars with cream. Syrups, including chocolate syrup. Sugar, honey, jelly.  Fats and Oils Margarine, butter, cream, sour cream, and oils. Other Broth and cream soups. Strained, broth-based soups. The items listed above may not be a complete list of recommended foods or beverages. Contact your dietitian for more options.  WHAT FOODS CAN I NOT EAT? Grains All breads. Grains are not allowed unless they are pureed into soup. Vegetables Vegetables are not allowed unless they are juiced, or cooked and pureed into soup. Fruits Fruits are not allowed unless they are juiced. Meats and Other Protein Sources Any meat or fish. Cooked or raw eggs. Nut butters.   Dairy Cheese.  Condiments Stone ground mustards. Fats and Oils Fats that are coarse or chunky. Sweets and Desserts Ice cream or other frozen desserts that have any solids in them or on top, such as nuts, chocolate chips, and pieces of cookies. Cakes. Cookies. Candy. Others Soups with chunks or pieces in them. The items listed  above may not be a complete list of foods and beverages to avoid. Contact your dietitian for more information. Document Released: 04/20/2005 Document Revised: 04/25/2013 Document Reviewed: 02/23/2013 [facility] Patient Information 2015 [place], [place]. This information is not intended to replace advice given to you by your health care provider. Make sure you discuss any questions you have with your health care provider.
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
## 2014-10-14 NOTE — ED Notes (Signed)
Pt had procedure to place teeth and suture them back in place as well as suture her upper lip.  Pt tolerated this well
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
## 2015-02-03 ENCOUNTER — Emergency Department (HOSPITAL_COMMUNITY): Payer: Self-pay

## 2015-02-03 ENCOUNTER — Emergency Department (HOSPITAL_COMMUNITY)
Admission: EM | Admit: 2015-02-03 | Discharge: 2015-02-03 | Disposition: A | Discharge status: Home / Self Care | Payer: Self-pay | Attending: Emergency Medicine | Admitting: Emergency Medicine

## 2015-02-03 ENCOUNTER — Encounter (HOSPITAL_COMMUNITY): Payer: Self-pay

## 2015-02-03 DIAGNOSIS — R51 Headache: Secondary | ICD-10-CM | POA: Insufficient documentation

## 2015-02-03 DIAGNOSIS — R079 Chest pain, unspecified: Secondary | ICD-10-CM | POA: Insufficient documentation

## 2015-02-03 DIAGNOSIS — R112 Nausea with vomiting, unspecified: Secondary | ICD-10-CM | POA: Insufficient documentation

## 2015-02-03 DIAGNOSIS — Z8739 Personal history of other diseases of the musculoskeletal system and connective tissue: Secondary | ICD-10-CM | POA: Insufficient documentation

## 2015-02-03 DIAGNOSIS — R42 Dizziness and giddiness: Secondary | ICD-10-CM | POA: Insufficient documentation

## 2015-02-03 DIAGNOSIS — R519 Headache, unspecified: Secondary | ICD-10-CM

## 2015-02-03 DIAGNOSIS — Z720 Tobacco use: Secondary | ICD-10-CM | POA: Insufficient documentation

## 2015-02-03 DIAGNOSIS — Z91040 Latex allergy status: Secondary | ICD-10-CM | POA: Insufficient documentation

## 2015-02-03 DIAGNOSIS — H538 Other visual disturbances: Secondary | ICD-10-CM | POA: Insufficient documentation

## 2015-02-03 LAB — BASIC METABOLIC PANEL
Anion gap: 3 — ABNORMAL LOW (ref 5–15)
BUN: 11 mg/dL (ref 6–20)
CALCIUM: 9 mg/dL (ref 8.9–10.3)
CHLORIDE: 107 mmol/L (ref 101–111)
CO2: 26 mmol/L (ref 22–32)
CREATININE: 0.73 mg/dL (ref 0.44–1.00)
GFR calc Af Amer: >60 mL/min (ref >60)
GFR calc non Af Amer: >60 mL/min (ref >60)
GLUCOSE: 87 mg/dL (ref 65–99)
Potassium: 3.7 mmol/L (ref 3.5–5.1)
Sodium: 136 mmol/L (ref 135–145)

## 2015-02-03 LAB — CBC
HCT: 37.9 % (ref 36.0–46.0)
HEMOGLOBIN: 12.8 g/dL (ref 12.0–15.0)
MCH: 27.8 pg (ref 26.0–34.0)
MCHC: 33.8 g/dL (ref 30.0–36.0)
MCV: 82.2 fL (ref 78.0–100.0)
PLATELETS: 217 10*3/uL (ref 150–400)
RBC: 4.61 MIL/uL (ref 3.87–5.11)
RDW: 14.5 % (ref 11.5–15.5)
WBC: 5.9 10*3/uL (ref 4.0–10.5)

## 2015-02-03 LAB — I-STAT TROPONIN, ED: TROPONIN I, POC: 0 ng/mL (ref 0.00–0.08)

## 2015-02-03 MED ORDER — IBUPROFEN 800 MG PO TABS
800.0000 mg | ORAL_TABLET | Freq: Once | ORAL | Status: AC
  Administered 2015-02-03: 800 mg via ORAL
  Filled 2015-02-03: qty 1

## 2015-02-03 MED ORDER — PROPARACAINE HCL 0.5 % OP SOLN
1.0000 [drp] | Freq: Once | OPHTHALMIC | Status: AC
  Administered 2015-02-03: 1 [drp] via OPHTHALMIC
  Filled 2015-02-03: qty 15

## 2015-02-03 MED ORDER — FLUORESCEIN SODIUM 1 MG OP STRP
1.0000 | ORAL_STRIP | Freq: Once | OPHTHALMIC | Status: AC
  Administered 2015-02-03: 1 via OPHTHALMIC
  Filled 2015-02-03: qty 1

## 2015-02-03 MED ORDER — MECLIZINE HCL 32 MG PO TABS: 32.0000 mg | ORAL_TABLET | Freq: Three times a day (TID) | ORAL | Status: DC | PRN

## 2015-02-03 MED ORDER — ONDANSETRON 8 MG PO TBDP
8.0000 mg | ORAL_TABLET | Freq: Once | ORAL | Status: AC
  Administered 2015-02-03: 8 mg via ORAL
  Filled 2015-02-03: qty 1

## 2015-02-03 NOTE — ED Notes (Signed)
Informed PA of patient's low pulse and heart rate

## 2015-02-03 NOTE — ED Notes (Signed)
Informed PA of patient's Pulse and HR

## 2015-02-03 NOTE — Discharge Instructions (Signed)
Chest Pain (Nonspecific) °It is often hard to give a specific diagnosis for the cause of chest pain. There is always a chance that your pain could be related to something serious, such as a heart attack or a blood clot in the lungs. You need to follow up with your health care provider for further evaluation. °CAUSES  °· Heartburn. °· Pneumonia or bronchitis. °· Anxiety or stress. °· Inflammation around your heart (pericarditis) or lung (pleuritis or pleurisy). °· A blood clot in the lung. °· A collapsed lung (pneumothorax). It can develop suddenly on its own (spontaneous pneumothorax) or from trauma to the chest. °· Shingles infection (herpes zoster virus). °The chest wall is composed of bones, muscles, and cartilage. Any of these can be the source of the pain. °· The bones can be bruised by injury. °· The muscles or cartilage can be strained by coughing or overwork. °· The cartilage can be affected by inflammation and become sore (costochondritis). °DIAGNOSIS  °Lab tests or other studies may be needed to find the cause of your pain. Your health care provider may have you take a test called an ambulatory electrocardiogram (ECG). An ECG records your heartbeat patterns over a 24-hour period. You may also have other tests, such as: °· Transthoracic echocardiogram (TTE). During echocardiography, sound waves are used to evaluate how blood flows through your heart. °· Transesophageal echocardiogram (TEE). °· Cardiac monitoring. This allows your health care provider to monitor your heart rate and rhythm in real time. °· Holter monitor. This is a portable device that records your heartbeat and can help diagnose heart arrhythmias. It allows your health care provider to track your heart activity for several days, if needed. °· Stress tests by exercise or by giving medicine that makes the heart beat faster. °TREATMENT  °· Treatment depends on what may be causing your chest pain. Treatment may include: °· Acid blockers for  heartburn. °· Anti-inflammatory medicine. °· Pain medicine for inflammatory conditions. °· Antibiotics if an infection is present. °· You may be advised to change lifestyle habits. This includes stopping smoking and avoiding alcohol, caffeine, and chocolate. °· You may be advised to keep your head raised (elevated) when sleeping. This reduces the chance of acid going backward from your stomach into your esophagus. °Most of the time, nonspecific chest pain will improve within 2-3 days with rest and mild pain medicine.  °HOME CARE INSTRUCTIONS  °· If antibiotics were prescribed, take them as directed. Finish them even if you start to feel better. °· For the next few days, avoid physical activities that bring on chest pain. Continue physical activities as directed. °· Do not use any tobacco products, including cigarettes, chewing tobacco, or electronic cigarettes. °· Avoid drinking alcohol. °· Only take medicine as directed by your health care provider. °· Follow your health care provider's suggestions for further testing if your chest pain does not go away. °· Keep any follow-up appointments you made. If you do not go to an appointment, you could develop lasting (chronic) problems with pain. If there is any problem keeping an appointment, call to reschedule. °SEEK MEDICAL CARE IF:  °· Your chest pain does not go away, even after treatment. °· You have a rash with blisters on your chest. °· You have a fever. °SEEK IMMEDIATE MEDICAL CARE IF:  °· You have increased chest pain or pain that spreads to your arm, neck, jaw, back, or abdomen. °· You have shortness of breath. °· You have an increasing cough, or you cough   up blood.  You have severe back or abdominal pain.  You feel nauseous or vomit.  You have severe weakness.  You faint.  You have chills. This is an emergency. Do not wait to see if the pain will go away. Get medical help at once. Call your local emergency services (911 in [place] Do not drive  yourself to the hospital. MAKE SURE YOU:   Understand these instructions.  Will watch your condition.  Will get help right away if you are not doing well or get worse. Document Released: 01/28/2005 Document Revised: 04/25/2013 Document Reviewed: 11/24/2007 [facility] Patient Information 2015 [place], [place]. This information is not intended to replace advice given to you by your health care provider. Make sure you discuss any questions you have with your health care provider.  Benign Positional Vertigo Vertigo means you feel like you or your surroundings are moving when they are not. Benign positional vertigo is the most common form of vertigo. Benign means that the cause of your condition is not serious. Benign positional vertigo is more common in older adults. CAUSES  Benign positional vertigo is the result of an upset in the labyrinth system. This is an area in the middle ear that helps control your balance. This may be caused by a viral infection, head injury, or repetitive motion. However, often no specific cause is found. SYMPTOMS  Symptoms of benign positional vertigo occur when you move your head or eyes in different directions. Some of the symptoms may include:  Loss of balance and falls.  Vomiting.  Blurred vision.  Dizziness.  Nausea.  Involuntary eye movements (nystagmus). DIAGNOSIS  Benign positional vertigo is usually diagnosed by physical exam. If the specific cause of your benign positional vertigo is unknown, your caregiver may perform imaging tests, such as magnetic resonance imaging (MRI) or computed tomography (CT). TREATMENT  Your caregiver may recommend movements or procedures to correct the benign positional vertigo. Medicines such as meclizine, benzodiazepines, and medicines for nausea may be used to treat your symptoms. In rare cases, if your symptoms are caused by certain conditions that affect the inner ear, you may need surgery. HOME CARE INSTRUCTIONS    Follow your caregiver's instructions.  Move slowly. Do not make sudden body or head movements.  Avoid driving.  Avoid operating heavy machinery.  Avoid performing any tasks that would be dangerous to you or others during a vertigo episode.  Drink enough fluids to keep your urine clear or pale yellow. SEEK IMMEDIATE MEDICAL CARE IF:   You develop problems with walking, weakness, numbness, or using your arms, hands, or legs.  You have difficulty speaking.  You develop severe headaches.  Your nausea or vomiting continues or gets worse.  You develop visual changes.  Your family or friends notice any behavioral changes.  Your condition gets worse.  You have a fever.  You develop a stiff neck or sensitivity to light. MAKE SURE YOU:   Understand these instructions.  Will watch your condition.  Will get help right away if you are not doing well or get worse. Document Released: 01/26/2006 Document Revised: 07/13/2011 Document Reviewed: 01/08/2011 [facility] Patient Information 2015 [place], [place]. This information is not intended to replace advice given to you by your health care provider. Make sure you discuss any questions you have with your health care provider.  General Headache Without Cause A headache is pain or discomfort felt around the head or neck area. The specific cause of a headache may not be found. There are  many causes and types of headaches. A few common ones are:  Tension headaches.  Migraine headaches.  Cluster headaches.  Chronic daily headaches. HOME CARE INSTRUCTIONS   Keep all follow-up appointments with your caregiver or any specialist referral.  Only take over-the-counter or prescription medicines for pain or discomfort as directed by your caregiver.  Lie down in a dark, quiet room when you have a headache.  Keep a headache journal to find out what may trigger your migraine headaches. For example, write down:  What you eat and  drink.  How much sleep you get.  Any change to your diet or medicines.  Try massage or other relaxation techniques.  Put ice packs or heat on the head and neck. Use these 3 to 4 times per day for 15 to 20 minutes each time, or as needed.  Limit stress.  Sit up straight, and do not tense your muscles.  Quit smoking if you smoke.  Limit alcohol use.  Decrease the amount of caffeine you drink, or stop drinking caffeine.  Eat and sleep on a regular schedule.  Get 7 to 9 hours of sleep, or as recommended by your caregiver.  Keep lights dim if bright lights bother you and make your headaches worse. SEEK MEDICAL CARE IF:   You have problems with the medicines you were prescribed.  Your medicines are not working.  You have a change from the usual headache.  You have nausea or vomiting. SEEK IMMEDIATE MEDICAL CARE IF:   Your headache becomes severe.  You have a fever.  You have a stiff neck.  You have loss of vision.  You have muscular weakness or loss of muscle control.  You start losing your balance or have trouble walking.  You feel faint or pass out.  You have severe symptoms that are different from your first symptoms. MAKE SURE YOU:   Understand these instructions.  Will watch your condition.  Will get help right away if you are not doing well or get worse. Document Released: 04/20/2005 Document Revised: 07/13/2011 Document Reviewed: 05/06/2011 [facility] Patient Information 2015 [place], [place]. This information is not intended to replace advice given to you by your health care provider. Make sure you discuss any questions you have with your health care provider.  Neuropathic Pain We often think that pain has a physical cause. If we get rid of the cause, the pain should go away. Nerves themselves can also cause pain. It is called neuropathic pain, which means nerve abnormality. It may be difficult for the patients who have it and for the treating caregivers.  Pain is usually described as acute (short-lived) or chronic (long-lasting). Acute pain is related to the physical sensations caused by an injury. It can last from a few seconds to many weeks, but it usually goes away when normal healing occurs. Chronic pain lasts beyond the typical healing time. With neuropathic pain, the nerve fibers themselves may be damaged or injured. They then send incorrect signals to other pain centers. The pain you feel is real, but the cause is not easy to find.  CAUSES  Chronic pain can result from diseases, such as diabetes and shingles (an infection related to chickenpox), or from trauma, surgery, or amputation. It can also happen without any known injury or disease. The nerves are sending pain messages, even though there is no identifiable cause for such messages.   Other common causes of neuropathy include diabetes, phantom limb pain, or Regional Pain Syndrome (RPS).  As with  all forms of chronic back pain, if neuropathy is not correctly treated, there can be a number of associated problems that lead to a downward cycle for the patient. These include depression, sleeplessness, feelings of fear and anxiety, limited social interaction and inability to do normal daily activities or work.  The most dramatic and mysterious example of neuropathic pain is called "phantom limb syndrome." This occurs when an arm or a leg has been removed because of illness or injury. The brain still gets pain messages from the nerves that originally carried impulses from the missing limb. These nerves now seem to misfire and cause troubling pain.  Neuropathic pain often seems to have no cause. It responds poorly to standard pain treatment. Neuropathic pain can occur after:  Shingles (herpes zoster virus infection).  A lasting burning sensation of the skin, caused usually by injury to a peripheral nerve.  Peripheral neuropathy which is widespread nerve damage, often caused by diabetes or  alcoholism.  Phantom limb pain following an amputation.  Facial nerve problems (trigeminal neuralgia).  Multiple sclerosis.  Reflex sympathetic dystrophy.  Pain which comes with cancer and cancer chemotherapy.  Entrapment neuropathy such as when pressure is put on a nerve such as in carpal tunnel syndrome.  Back, leg, and hip problems (sciatica).  Spine or back surgery.  HIV Infection or AIDS where nerves are infected by viruses. Your caregiver can explain items in the above list which may apply to you. SYMPTOMS  Characteristics of neuropathic pain are:  Severe, sharp, electric shock-like, shooting, lightening-like, knife-like.  Pins and needles sensation.  Deep burning, deep cold, or deep ache.  Persistent numbness, tingling, or weakness.  Pain resulting from light touch or other stimulus that would not usually cause pain.  Increased sensitivity to something that would normally cause pain, such as a pinprick. Pain may persist for months or years following the healing of damaged tissues. When this happens, pain signals no longer sound an alarm about current injuries or injuries about to happen. Instead, the alarm system itself is not working correctly.  Neuropathic pain may get worse instead of better over time. For some people, it can lead to serious disability. It is important to be aware that severe injury in a limb can occur without a proper, protective pain response.Burns, cuts, and other injuries may go unnoticed. Without proper treatment, these injuries can become infected or lead to further disability. Take any injury seriously, and consult your caregiver for treatment. DIAGNOSIS  When you have a pain with no known cause, your caregiver will probably ask some specific questions:   Do you have any other conditions, such as diabetes, shingles, multiple sclerosis, or HIV infection?  How would you describe your pain? (Neuropathic pain is often described as shooting,  stabbing, burning, or searing.)  Is your pain worse at any time of the day? (Neuropathic pain is usually worse at night.)  Does the pain seem to follow a certain physical pathway?  Does the pain come from an area that has missing or injured nerves? (An example would be phantom limb pain.)  Is the pain triggered by minor things such as rubbing against the sheets at night? These questions often help define the type of pain involved. Once your caregiver knows what is happening, treatment can begin. Anticonvulsant, antidepressant drugs, and various pain relievers seem to work in some cases. If another condition, such as diabetes is involved, better management of that disorder may relieve the neuropathic pain.  TREATMENT  Neuropathic pain  is frequently long-lasting and tends not to respond to treatment with narcotic type pain medication. It may respond well to other drugs such as antiseizure and antidepressant medications. Usually, neuropathic problems do not completely go away, but partial improvement is often possible with proper treatment. Your caregivers have large numbers of medications available to treat you. Do not be discouraged if you do not get immediate relief. Sometimes different medications or a combination of medications will be tried before you receive the results you are hoping for. See your caregiver if you have pain that seems to be coming from nowhere and does not go away. Help is available.  SEEK IMMEDIATE MEDICAL CARE IF:   There is a sudden change in the quality of your pain, especially if the change is on only one side of the body.  You notice changes of the skin, such as redness, black or purple discoloration, swelling, or an ulcer.  You cannot move the affected limbs. Document Released: 01/16/2004 Document Revised: 07/13/2011 Document Reviewed: 01/16/2004 [facility] Patient Information 2015 [place], [place]. This information is not intended to replace advice given to you by your  health care provider. Make sure you discuss any questions you have with your health care provider.

## 2015-02-03 NOTE — ED Provider Notes (Signed)
CSN: [id number]     Arrival date & time 02/03/15  1637 History   First MD Initiated Contact with Patient 02/03/15 1716     Chief Complaint  Patient presents with  . Headache  . Chest Pain   HPI  Kathleen Mathews is a 29 year old female presenting with head tingling and chest pain. Pt states that for the past week she has had left sided eye twitching followed by left sided facial and scalp tingling. She reports these episodes last for a few seconds at a time and occur multiple times a day. She also endorses increased fatigue over the past week. She reports that 3 days ago she started experiencing intermittent chest pain. Pain is described as central light pressure. She states the episodes last only a few seconds and occur once a day. She denies any exacerbating factors and states they occur "randomly". The chest pain is not associated with lightheadedness, nausea, SOB or diaphoresis. She is also complaining of dizziness described as "room spinning" when she rolls over in bed. She states that this makes her nauseous and she vomited once this morning. She is not dizzy when laying still or walking. She has not tried any OTC medications for relief. 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 She has no personal or family cardiac history. Denies fevers, chills, SOB, cough, abdominal pain, dysuria, rash or vision loss.   Past Medical History  Diagnosis Date  . Thyroid disease    Past Surgical History  Procedure Laterality Date  . Intrauterine device insertion     Family History  Problem Relation Age of Onset  . Hypertension Mother   . Obesity Father    Social History  Substance Use Topics  . Smoking status: Current Some Day Smoker -- 0.25 packs/day    Types: Cigarettes  . Smokeless tobacco: Never Used  . Alcohol Use: Yes     Comment: socially   OB History    No data available     Review of Systems  Constitutional: Positive for  fatigue. Negative for fever, chills and diaphoresis.  HENT: Negative for congestion, ear pain, rhinorrhea, sinus pressure and sore throat.   Eyes: Positive for visual disturbance. Negative for pain.  Respiratory: Negative for cough and shortness of breath.   Cardiovascular: Positive for chest pain. Negative for palpitations.  Gastrointestinal: Positive for nausea and vomiting. Negative for abdominal pain and diarrhea.  Genitourinary: Negative for dysuria and flank pain.  Skin: Negative for rash.  Allergic/Immunologic: Negative for immunocompromised state.  Neurological: Positive for dizziness and headaches. Negative for syncope, weakness and light-headedness.      Allergies  Latex  Home Medications   Prior to Admission medications   Medication Sig Start Date End Date Taking? Authorizing Provider  loratadine (ALLERGY RELIEF) 10 MG tablet Take 10 mg by mouth daily as needed for allergies.   Yes Historical Provider, MD  doxycycline (VIBRA-TABS) 100 MG tablet Take 1 tablet (100 mg total) by mouth 2 (two) times daily. Patient not taking: Reported on 02/03/2015 10/14/14   Arthor Captain, PA-C  meclizine (ANTIVERT) 32 MG tablet Take 1 tablet (32 mg total) by mouth 3 (three) times daily as needed. 02/03/15   Kathryn Linarez, PA-C  oxyCODONE-acetaminophen (PERCOCET) 5-325 MG per tablet Take 1-2 tablets by mouth every 4 (four) hours as needed. Patient not taking: Reported on 02/03/2015 10/14/14   Arthor Captain, PA-C   BP 110/66 mmHg  Pulse 60  Temp(Src) 98.5 F (36.9 C) (Oral)  Resp 14  Ht 5' (1.524 m)  Wt 155 lb (70.308 kg)  BMI 30.27 kg/m2  SpO2 100% Physical Exam  Constitutional: She is oriented to person, place, and time. She appears well-developed and well-nourished. No distress.  HENT:  Head: Normocephalic and atraumatic.  Mouth/Throat: Oropharynx is clear and moist. No oropharyngeal exudate.  Left sided face and scalp TTP. Tenderness does not cross midline.   Eyes: Conjunctivae and  EOM are normal. Pupils are equal, round, and reactive to light. Right eye exhibits no discharge. Left eye exhibits no discharge. Right conjunctiva is not injected. Left conjunctiva is not injected. No scleral icterus.  Painless EOM. Fluorescein exam shows no corneal defects or ulcerations. Tonopen with intraocular pressures of 20 (L) and 18 (R).   Neck: Normal range of motion.  Cardiovascular: Normal rate, regular rhythm and normal heart sounds.   Pulmonary/Chest: Effort normal and breath sounds normal. No respiratory distress. She has no wheezes. She has no rales.  Abdominal: Soft. Bowel sounds are normal. She exhibits no distension. There is no tenderness. There is no rebound and no guarding.  Musculoskeletal: Normal range of motion.  Pt moves all extremities spontaneously.   Neurological: She is alert and oriented to person, place, and time. No cranial nerve deficit. Coordination normal.  Cranial nerves 3-12 intact. 5/5 motor of BUE and BLE. Sensation to light touch intact. Finger to nose coordinated. Pt walks with steady gait.   Skin: Skin is warm and dry.  No rashes over head, neck or extremities.   Psychiatric: She has a normal mood and affect. Her behavior is normal.  Nursing note and vitals reviewed.   ED Course  Procedures (including critical care time) Labs Review Labs Reviewed  BASIC METABOLIC PANEL - Abnormal; Notable for the following:    Anion gap 3 (*)    All other components within normal limits  CBC  I-STAT TROPOININ, ED    Imaging Review Dg Chest 2 View  02/03/2015   CLINICAL DATA:  Chest pain.  EXAM: CHEST  2 VIEW  COMPARISON:  05/21/2009 chest radiograph  FINDINGS: Stable cardiomediastinal silhouette with normal heart size. No pneumothorax. No pleural effusion. Clear lungs, with no focal lung consolidation and no pulmonary edema. Visualized osseous structures appear intact.  IMPRESSION: No active disease in the chest.   Electronically Signed   By: Delbert Phenix M.D.    On: 02/03/2015 18:34   I have personally reviewed and evaluated these images and lab results as part of my medical decision-making.   EKG Interpretation   Date/Time:  Sunday February 03 2015 16:51:56 EDT Ventricular Rate:  81 PR Interval:  123 QRS Duration: 84 QT Interval:  362 QTC Calculation: 420 R Axis:   71 Text Interpretation:  Sinus rhythm No prior for comparison Confirmed by  Gwendolyn Grant  MD, BLAIR ([id number]) on 02/03/2015 5:48:41 PM     7:30 - After initial exam, pt is now reporting that she has blurred vision with her episodes of head tingling.   MDM   Final diagnoses:  Vertigo  Headache, unspecified headache type  Chest pain, unspecified chest pain type   Pt presenting with left sided head/scalp tingling and pain x 1 week, chest pain x 3 days and dizziness x 3 days. All symptoms are episodic and not related to the other. Head tingling occurs for a few seconds then resolves. After initial interview, pt states she now feels like she has blurred vision in the left eye  with the tingling episode. Pt also has once daily episodes of "light chest pressure" that lasts for a few seconds then resolves. No associated or exacerbating factors. Pt also describing "room spinning" dizziness when she rolls over in bed. 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 VSS. Pt is nontoxic appearing. No visible rashes over face or scalp. Pt is TTP over left face and scalp. Eyes are not injected with normal, painless EOM. PERRL.Fluorescein exam shows no corneal defects. Intraocular pressures 20 (L) and 18 (R). Heart RRR. Lungs CTAB. Abdomen is soft, non-tender. Non-focal neuro exam. Labwork reassuring. ECG normal sinus rhythm. Troponin 0.00. CXR shows no active disease. After placing fluorescein dye into pt's eye, she reports that it resolved her blurred vision and improved her facial pain. Pt unlikely to have herpes zoster affecting face or eye. Chest pain is atypical and unlikely to be  cardiac source. Pt does have symptoms of vertigo and will be treated with meclizine. Will refer to neurology for tingling symptoms of head. Pt does not have PCP, given information for [facility] and instructed to make follow up appointment. Return precautions given in discharge paperwork and discussed with pt at bedside. Pt stable for discharge     Alveta Heimlich, PA-C 02/04/15 1233  Elwin Mocha, MD 02/05/15 [phone]

## 2015-02-03 NOTE — ED Notes (Signed)
Patient reports that she has had a left-sided headache and tinglingx 1 week. Patient then reports that she has had mid chest pressure with vomiting x 1 and dizziness x 3 days.

## 2015-02-25 ENCOUNTER — Ambulatory Visit: Payer: Self-pay | Admitting: Neurology

## 2015-02-25 ENCOUNTER — Telehealth: Payer: Self-pay | Admitting: *Deleted

## 2015-02-25 NOTE — Telephone Encounter (Signed)
Patient no-showed new patient appointment.  

## 2015-02-26 ENCOUNTER — Encounter: Payer: Self-pay | Admitting: Neurology

## 2015-03-20 ENCOUNTER — Encounter (HOSPITAL_COMMUNITY): Payer: Self-pay | Admitting: Emergency Medicine

## 2015-03-20 ENCOUNTER — Emergency Department (HOSPITAL_COMMUNITY)
Admission: EM | Admit: 2015-03-20 | Discharge: 2015-03-20 | Disposition: A | Discharge status: Home / Self Care | Payer: Self-pay | Attending: Emergency Medicine | Admitting: Emergency Medicine

## 2015-03-20 DIAGNOSIS — Z91040 Latex allergy status: Secondary | ICD-10-CM | POA: Insufficient documentation

## 2015-03-20 DIAGNOSIS — R32 Unspecified urinary incontinence: Secondary | ICD-10-CM | POA: Insufficient documentation

## 2015-03-20 DIAGNOSIS — R51 Headache: Secondary | ICD-10-CM | POA: Insufficient documentation

## 2015-03-20 DIAGNOSIS — R112 Nausea with vomiting, unspecified: Secondary | ICD-10-CM | POA: Insufficient documentation

## 2015-03-20 DIAGNOSIS — R1030 Lower abdominal pain, unspecified: Secondary | ICD-10-CM | POA: Insufficient documentation

## 2015-03-20 DIAGNOSIS — Z8639 Personal history of other endocrine, nutritional and metabolic disease: Secondary | ICD-10-CM | POA: Insufficient documentation

## 2015-03-20 DIAGNOSIS — F17210 Nicotine dependence, cigarettes, uncomplicated: Secondary | ICD-10-CM | POA: Insufficient documentation

## 2015-03-20 DIAGNOSIS — Z3202 Encounter for pregnancy test, result negative: Secondary | ICD-10-CM | POA: Insufficient documentation

## 2015-03-20 LAB — COMPREHENSIVE METABOLIC PANEL
ALBUMIN: 4.1 g/dL (ref 3.5–5.0)
ALT: 11 U/L — ABNORMAL LOW (ref 14–54)
ANION GAP: 7 (ref 5–15)
AST: 16 U/L (ref 15–41)
Alkaline Phosphatase: 45 U/L (ref 38–126)
BILIRUBIN TOTAL: 0.9 mg/dL (ref 0.3–1.2)
BUN: 12 mg/dL (ref 6–20)
CHLORIDE: 106 mmol/L (ref 101–111)
CO2: 23 mmol/L (ref 22–32)
Calcium: 9.4 mg/dL (ref 8.9–10.3)
Creatinine, Ser: 0.7 mg/dL (ref 0.44–1.00)
GFR calc Af Amer: >60 mL/min (ref >60)
GFR calc non Af Amer: >60 mL/min (ref >60)
GLUCOSE: 111 mg/dL — AB (ref 65–99)
POTASSIUM: 3.4 mmol/L — AB (ref 3.5–5.1)
SODIUM: 136 mmol/L (ref 135–145)
TOTAL PROTEIN: 7.7 g/dL (ref 6.5–8.1)

## 2015-03-20 LAB — CBC WITH DIFFERENTIAL/PLATELET
BASOS ABS: 0 10*3/uL (ref 0.0–0.1)
Basophils Relative: 0 %
Eosinophils Absolute: 0 10*3/uL (ref 0.0–0.7)
Eosinophils Relative: 0 %
HEMATOCRIT: 40.7 % (ref 36.0–46.0)
HEMOGLOBIN: 14 g/dL (ref 12.0–15.0)
LYMPHS PCT: 17 %
Lymphs Abs: 1.7 10*3/uL (ref 0.7–4.0)
MCH: 27.8 pg (ref 26.0–34.0)
MCHC: 34.4 g/dL (ref 30.0–36.0)
MCV: 80.8 fL (ref 78.0–100.0)
MONO ABS: 0.7 10*3/uL (ref 0.1–1.0)
Monocytes Relative: 7 %
NEUTROS ABS: 7.6 10*3/uL (ref 1.7–7.7)
NEUTROS PCT: 76 %
PLATELETS: 235 10*3/uL (ref 150–400)
RBC: 5.04 MIL/uL (ref 3.87–5.11)
RDW: 13.8 % (ref 11.5–15.5)
WBC: 10 10*3/uL (ref 4.0–10.5)

## 2015-03-20 LAB — URINALYSIS, ROUTINE W REFLEX MICROSCOPIC
Glucose, UA: NEGATIVE mg/dL
Hgb urine dipstick: NEGATIVE
KETONES UR: 40 mg/dL — AB
LEUKOCYTES UA: NEGATIVE
NITRITE: NEGATIVE
PH: 5.5 (ref 5.0–8.0)
Protein, ur: NEGATIVE mg/dL
Specific Gravity, Urine: 1.035 — ABNORMAL HIGH (ref 1.005–1.030)

## 2015-03-20 LAB — POC URINE PREG, ED: Preg Test, Ur: NEGATIVE

## 2015-03-20 LAB — LIPASE, BLOOD: Lipase: 26 U/L (ref 11–51)

## 2015-03-20 MED ORDER — PROMETHAZINE HCL 25 MG PO TABS: 25.0000 mg | ORAL_TABLET | Freq: Four times a day (QID) | ORAL | Status: DC | PRN

## 2015-03-20 MED ORDER — SODIUM CHLORIDE 0.9 % IV BOLUS (SEPSIS)
1000.0000 mL | Freq: Once | INTRAVENOUS | Status: AC
  Administered 2015-03-20: 1000 mL via INTRAVENOUS

## 2015-03-20 MED ORDER — ONDANSETRON HCL 4 MG/2ML IJ SOLN
4.0000 mg | Freq: Once | INTRAMUSCULAR | Status: AC
  Administered 2015-03-20: 4 mg via INTRAVENOUS

## 2015-03-20 NOTE — ED Notes (Signed)
Pt states that she has had N/V and generalized body aches x 24 hours and has been incontinent of urine. Alert and oriented.

## 2015-03-20 NOTE — ED Notes (Signed)
Patient given OJ to drink, encouraged patient to drink slowly.

## 2015-03-20 NOTE — ED Notes (Signed)
Patient c/o nausea with emesis, too numerous to count, x24 hours with generalized body aches and stress urinary incontinence. Patient c/o feeling cold and having chills. Temp 99.0 at this time.

## 2015-03-20 NOTE — Discharge Instructions (Signed)

## 2015-03-20 NOTE — ED Provider Notes (Signed)
CSN: [id number]     Arrival date & time 03/20/15  0142 History  By signing my name below, I, Kathleen Mathews, attest that this documentation has been prepared under the direction and in the presence of Gilda Crease, MD. Electronically Signed: Tanda Mathews, ED Scribe. 03/20/2015. 3:03 AM.  Chief Complaint  Patient presents with  . Emesis  . Generalized Body Aches   The history is provided by the patient. No language interpreter was used.     HPI Comments: Kathleen Mathews is a 29 y.o. female who presents to the Emergency Department complaining of sudden onset, constant, nausea and vomiting x 1 day. Pt states she is also having urinary incontinence, lower abdominal pain, bilateral flank pain, and headaches as well. Denies diarrhea or any other associated symptoms.    Past Medical History  Diagnosis Date  . Thyroid disease    Past Surgical History  Procedure Laterality Date  . Intrauterine device insertion     Family History  Problem Relation Age of Onset  . Hypertension Mother   . Obesity Father    Social History  Substance Use Topics  . Smoking status: Current Some Day Smoker -- 0.25 packs/day    Types: Cigarettes  . Smokeless tobacco: Never Used  . Alcohol Use: Yes     Comment: socially   OB History    No data available     Review of Systems  Gastrointestinal: Positive for nausea, vomiting and abdominal pain. Negative for diarrhea.  Genitourinary: Positive for flank pain.       + Urinary incontinence  Neurological: Positive for headaches.  All other systems reviewed and are negative.     Allergies  Latex  Home Medications   Prior to Admission medications   Medication Sig Start Date End Date Taking? Authorizing Provider  loratadine (ALLERGY RELIEF) 10 MG tablet Take 10 mg by mouth daily as needed for allergies.   Yes Historical Provider, MD  meclizine (ANTIVERT) 32 MG tablet Take 1 tablet (32 mg total) by mouth 3 (three) times daily as  needed. Patient taking differently: Take 32 mg by mouth 3 (three) times daily as needed for nausea.  02/03/15  Yes Stevi Barrett, PA-C  doxycycline (VIBRA-TABS) 100 MG tablet Take 1 tablet (100 mg total) by mouth 2 (two) times daily. Patient not taking: Reported on 02/03/2015 10/14/14   Arthor Captain, PA-C  oxyCODONE-acetaminophen (PERCOCET) 5-325 MG per tablet Take 1-2 tablets by mouth every 4 (four) hours as needed. Patient not taking: Reported on 02/03/2015 10/14/14   Arthor Captain, PA-C   Triage Vitals: BP 112/68 mmHg  Pulse 109  Temp(Src) 99.3 F (37.4 C) (Oral)  Resp 26  SpO2 95%  LMP 03/06/2015   Physical Exam  Constitutional: She is oriented to person, place, and time. She appears well-developed and well-nourished. No distress.  HENT:  Head: Normocephalic and atraumatic.  Right Ear: Hearing normal.  Left Ear: Hearing normal.  Nose: Nose normal.  Mouth/Throat: Oropharynx is clear and moist and mucous membranes are normal.  Eyes: Conjunctivae and EOM are normal. Pupils are equal, round, and reactive to light.  Neck: Normal range of motion. Neck supple.  Cardiovascular: Regular rhythm, S1 normal and S2 normal.  Exam reveals no gallop and no friction rub.   No murmur heard. Pulmonary/Chest: Effort normal and breath sounds normal. No respiratory distress. She exhibits no tenderness.  Abdominal: Soft. Normal appearance and bowel sounds are normal. There is no hepatosplenomegaly. There is tenderness. There is no rebound, no  guarding, no tenderness at McBurney's point and negative Murphy's sign. No hernia.  Mild abdominal tenderness  Musculoskeletal: Normal range of motion.  Neurological: She is alert and oriented to person, place, and time. She has normal strength. No cranial nerve deficit or sensory deficit. Coordination normal. GCS eye subscore is 4. GCS verbal subscore is 5. GCS motor subscore is 6.  Skin: Skin is warm, dry and intact. No rash noted. No cyanosis.  Psychiatric: She  has a normal mood and affect. Her speech is normal and behavior is normal. Thought content normal.  Nursing note and vitals reviewed.   ED Course  Procedures (including critical care time)  DIAGNOSTIC STUDIES: Oxygen Saturation is 95% on RA, normal by my interpretation.    COORDINATION OF CARE: 3:02 AM-Discussed treatment plan with pt at bedside and pt agreed to plan.   Labs Review Labs Reviewed  COMPREHENSIVE METABOLIC PANEL - Abnormal; Notable for the following:    Potassium 3.4 (*)    Glucose, Bld 111 (*)    ALT 11 (*)    All other components within normal limits  URINALYSIS, ROUTINE W REFLEX MICROSCOPIC (NOT AT [facility]) - Abnormal; Notable for the following:    Color, Urine AMBER (*)    APPearance CLOUDY (*)    Specific Gravity, Urine 1.035 (*)    Bilirubin Urine SMALL (*)    Ketones, ur 40 (*)    All other components within normal limits  CBC WITH DIFFERENTIAL/PLATELET  LIPASE, BLOOD  POC URINE PREG, ED    Imaging Review No results found. I have personally reviewed and evaluated these lab results as part of my medical decision-making.   EKG Interpretation   Date/Time:  Wednesday March 20 2015 01:51:28 EST Ventricular Rate:  99 PR Interval:  113 QRS Duration: 82 QT Interval:  323 QTC Calculation: 414 R Axis:   58 Text Interpretation:  Sinus rhythm Borderline T abnormalities, inferior  leads No significant change since last tracing Confirmed by Littie Chiem  MD,  Maitlyn Penza [phone] on 03/20/2015 2:02:10 AM      MDM   Final diagnoses:  None  Vomiting  Presents to ER for nausea and vomiting. Patient reports that she has vomited multiple times. Patient reports diffuse abdominal cramping. She also has noticed that she has been incontinent of urine when she vomits. Her abdominal exam was benign, no concern for acute surgical process. Lab work was normal. Urinalysis does not suggest infection. Patient feeling much better after IV fluids and Zofran. Upon rechecking  her in her room, she was found to be eating [employer]. Patient will be discharged with continued symptomatically treatment.  I personally performed the services described in this documentation, which was scribed in my presence. The recorded information has been reviewed and is accurate.       Gilda Creasehristopher J Kimberle Stanfill, MD 03/20/15 [phone]

## 2015-03-20 NOTE — ED Notes (Signed)
Patient ambulated to restroom to attempt urine collection.

## 2015-03-20 NOTE — ED Notes (Signed)
Patient with visitor at bedside, he has brought patient [employer] food and drink.

## 2015-03-20 NOTE — ED Notes (Signed)
RN to get labs when starting IV.

## 2016-04-25 ENCOUNTER — Emergency Department (HOSPITAL_COMMUNITY)
Admission: EM | Admit: 2016-04-25 | Discharge: 2016-04-25 | Disposition: A | Discharge status: Home / Self Care | Payer: Self-pay | Attending: Emergency Medicine | Admitting: Emergency Medicine

## 2016-04-25 ENCOUNTER — Encounter (HOSPITAL_COMMUNITY): Payer: Self-pay | Admitting: Emergency Medicine

## 2016-04-25 ENCOUNTER — Emergency Department (HOSPITAL_COMMUNITY): Payer: Self-pay

## 2016-04-25 DIAGNOSIS — K0889 Other specified disorders of teeth and supporting structures: Secondary | ICD-10-CM | POA: Insufficient documentation

## 2016-04-25 DIAGNOSIS — Z5321 Procedure and treatment not carried out due to patient leaving prior to being seen by health care provider: Secondary | ICD-10-CM | POA: Insufficient documentation

## 2016-04-25 LAB — BASIC METABOLIC PANEL
Anion gap: 6 (ref 5–15)
BUN: 7 mg/dL (ref 6–20)
CALCIUM: 9.5 mg/dL (ref 8.9–10.3)
CHLORIDE: 108 mmol/L (ref 101–111)
CO2: 26 mmol/L (ref 22–32)
CREATININE: 0.88 mg/dL (ref 0.44–1.00)
GFR calc non Af Amer: >60 mL/min (ref >60)
Glucose, Bld: 116 mg/dL — ABNORMAL HIGH (ref 65–99)
Potassium: 3.5 mmol/L (ref 3.5–5.1)
SODIUM: 140 mmol/L (ref 135–145)

## 2016-04-25 LAB — CBC
HCT: 37.3 % (ref 36.0–46.0)
Hemoglobin: 12.3 g/dL (ref 12.0–15.0)
MCH: 26.8 pg (ref 26.0–34.0)
MCHC: 33 g/dL (ref 30.0–36.0)
MCV: 81.3 fL (ref 78.0–100.0)
PLATELETS: 252 10*3/uL (ref 150–400)
RBC: 4.59 MIL/uL (ref 3.87–5.11)
RDW: 14.1 % (ref 11.5–15.5)
WBC: 8.2 10*3/uL (ref 4.0–10.5)

## 2016-04-25 LAB — I-STAT TROPONIN, ED: TROPONIN I, POC: 0 ng/mL (ref 0.00–0.08)

## 2016-04-25 NOTE — ED Triage Notes (Signed)
Pt c/o pain to left upper and lower teeth ongoing for a while. Pt also reports vomiting and having chest pains.

## 2016-08-01 ENCOUNTER — Encounter (HOSPITAL_COMMUNITY): Payer: Self-pay

## 2016-08-01 ENCOUNTER — Emergency Department (HOSPITAL_COMMUNITY)
Admission: EM | Admit: 2016-08-01 | Discharge: 2016-08-01 | Disposition: A | Discharge status: Home / Self Care | Payer: Self-pay | Attending: Emergency Medicine | Admitting: Emergency Medicine

## 2016-08-01 DIAGNOSIS — F17210 Nicotine dependence, cigarettes, uncomplicated: Secondary | ICD-10-CM | POA: Insufficient documentation

## 2016-08-01 DIAGNOSIS — N3001 Acute cystitis with hematuria: Secondary | ICD-10-CM | POA: Insufficient documentation

## 2016-08-01 DIAGNOSIS — N898 Other specified noninflammatory disorders of vagina: Secondary | ICD-10-CM

## 2016-08-01 LAB — URINALYSIS, ROUTINE W REFLEX MICROSCOPIC
BILIRUBIN URINE: NEGATIVE
GLUCOSE, UA: NEGATIVE mg/dL
KETONES UR: NEGATIVE mg/dL
NITRITE: POSITIVE — AB
Protein, ur: 100 mg/dL — AB
Specific Gravity, Urine: 1.026 (ref 1.005–1.030)
pH: 5 (ref 5.0–8.0)

## 2016-08-01 LAB — WET PREP, GENITAL
Sperm: NONE SEEN
Trich, Wet Prep: NONE SEEN
Yeast Wet Prep HPF POC: NONE SEEN

## 2016-08-01 LAB — POC URINE PREG, ED: Preg Test, Ur: NEGATIVE

## 2016-08-01 MED ORDER — METRONIDAZOLE 500 MG PO TABS: 500.0000 mg | ORAL_TABLET | Freq: Two times a day (BID) | ORAL | 0 refills | Status: DC

## 2016-08-01 MED ORDER — PHENAZOPYRIDINE HCL 200 MG PO TABS: 200.0000 mg | ORAL_TABLET | Freq: Three times a day (TID) | ORAL | 0 refills | Status: DC | PRN

## 2016-08-01 MED ORDER — CEPHALEXIN 500 MG PO CAPS
500.0000 mg | ORAL_CAPSULE | Freq: Once | ORAL | Status: AC
  Administered 2016-08-01: 500 mg via ORAL
  Filled 2016-08-01: qty 1

## 2016-08-01 MED ORDER — CEPHALEXIN 500 MG PO CAPS: 500.0000 mg | ORAL_CAPSULE | Freq: Four times a day (QID) | ORAL | 0 refills | Status: DC

## 2016-08-01 NOTE — ED Provider Notes (Signed)
[facility]-EMERGENCY DEPT Provider Note   CSN: [id number] Arrival date & time: 08/01/16  0719     History   Chief Complaint Chief Complaint  Patient presents with  . Vaginal Bleeding    HPI Kathleen Mathews is a 31 y.o. female.  The history is provided by the patient and medical records. No language interpreter was used.    Kathleen Mathews is a 31 y.o. female  with a PMH of thyroid disease who presents to the Emergency Department complaining of vaginal pressure only when urinating which lasts a few seconds and then resolves. Started three days ago and getting worse. Associated with urinary frequency and urgency. No abdominal pain, flank pain or dysuria. States that she has been on Mirena for 8 years and that this was replaced 3 years ago. She has had no problems with IUD. She states that she typically has vaginal bleeding every other month that is consistent with a light period. The last 3 days, she has had mild vaginal bleeding, however it seems a little bit different than usual menses. She has also had white vaginal discharge. No fever or chills. No back pain, n/v.   Past Medical History:  Diagnosis Date  . Thyroid disease     There are no active problems to display for this patient.   Past Surgical History:  Procedure Laterality Date  . INTRAUTERINE DEVICE INSERTION      OB History    No data available       Home Medications    Prior to Admission medications   Medication Sig Start Date End Date Taking? Authorizing Provider  cephALEXin (KEFLEX) 500 MG capsule Take 1 capsule (500 mg total) by mouth 4 (four) times daily. 08/01/16   Chase Picket Ward, PA-C  doxycycline (VIBRA-TABS) 100 MG tablet Take 1 tablet (100 mg total) by mouth 2 (two) times daily. Patient not taking: Reported on 02/03/2015 10/14/14   Arthor Captain, PA-C  loratadine (ALLERGY RELIEF) 10 MG tablet Take 10 mg by mouth daily as needed for allergies.    Historical Provider, MD  meclizine  (ANTIVERT) 32 MG tablet Take 1 tablet (32 mg total) by mouth 3 (three) times daily as needed. Patient taking differently: Take 32 mg by mouth 3 (three) times daily as needed for nausea.  02/03/15   Stevi Barrett, PA-C  metroNIDAZOLE (FLAGYL) 500 MG tablet Take 1 tablet (500 mg total) by mouth 2 (two) times daily. 08/01/16   Chase Picket Ward, PA-C  oxyCODONE-acetaminophen (PERCOCET) 5-325 MG per tablet Take 1-2 tablets by mouth every 4 (four) hours as needed. Patient not taking: Reported on 02/03/2015 10/14/14   Arthor Captain, PA-C  phenazopyridine (PYRIDIUM) 200 MG tablet Take 1 tablet (200 mg total) by mouth 3 (three) times daily as needed for pain. 08/01/16   Chase Picket Ward, PA-C  promethazine (PHENERGAN) 25 MG tablet Take 1 tablet (25 mg total) by mouth every 6 (six) hours as needed for nausea or vomiting. 03/20/15   Gilda Crease, MD    Family History Family History  Problem Relation Age of Onset  . Hypertension Mother   . Obesity Father     Social History Social History  Substance Use Topics  . Smoking status: Current Some Day Smoker    Packs/day: 0.25    Types: Cigarettes  . Smokeless tobacco: Never Used  . Alcohol use Yes     Comment: socially     Allergies   Latex   Review of Systems Review of Systems  Constitutional: Negative for chills and fever.  HENT: Negative for congestion.   Eyes: Negative for visual disturbance.  Respiratory: Negative for cough and shortness of breath.   Cardiovascular: Negative for chest pain.  Gastrointestinal: Negative for abdominal pain, nausea and vomiting.  Genitourinary: Positive for frequency, urgency, vaginal bleeding and vaginal discharge.  Musculoskeletal: Negative for back pain and neck pain.  Skin: Negative for rash.  Neurological: Negative for headaches.     Physical Exam Updated Vital Signs BP 110/62 (BP Location: Left Arm)   Pulse (!) 56   Temp 98.8 F (37.1 C) (Oral)   Resp 18   SpO2 99%   Physical  Exam  Constitutional: She is oriented to person, place, and time. She appears well-developed and well-nourished. No distress.  HENT:  Head: Normocephalic and atraumatic.  Cardiovascular: Normal rate, regular rhythm and normal heart sounds.   No murmur heard. Pulmonary/Chest: Effort normal and breath sounds normal. No respiratory distress.  Abdominal: Soft. Bowel sounds are normal. She exhibits no distension.  No abdominal or CVA tenderness.   Genitourinary:  Genitourinary Comments: Chaperone present for exam.  No rashes, lesions, or tenderness to external genitalia. Small amount of white discharge. No CMT. No adnexal masses, tenderness, or fullness.  No bleeding within vaginal vault.  Neurological: She is alert and oriented to person, place, and time.  Skin: Skin is warm and dry.  Nursing note and vitals reviewed.    ED Treatments / Results  Labs (all labs ordered are listed, but only abnormal results are displayed) Labs Reviewed  WET PREP, GENITAL - Abnormal; Notable for the following:       Result Value   Clue Cells Wet Prep HPF POC PRESENT (*)    WBC, Wet Prep HPF POC FEW (*)    All other components within normal limits  URINALYSIS, ROUTINE W REFLEX MICROSCOPIC - Abnormal; Notable for the following:    APPearance TURBID (*)    Hgb urine dipstick LARGE (*)    Protein, ur 100 (*)    Nitrite POSITIVE (*)    Leukocytes, UA LARGE (*)    Bacteria, UA FEW (*)    Squamous Epithelial / LPF 6-30 (*)    Non Squamous Epithelial 0-5 (*)    All other components within normal limits  POC URINE PREG, ED  GC/CHLAMYDIA PROBE AMP ([place]) NOT AT [facility]    EKG  EKG Interpretation None       Radiology No results found.  Procedures Procedures (including critical care time)  Medications Ordered in ED Medications  cephALEXin (KEFLEX) capsule 500 mg (500 mg Oral Given 08/01/16 0912)     Initial Impression / Assessment and Plan / ED Course  I have reviewed the triage vital  signs and the nursing notes.  Pertinent labs & imaging results that were available during my care of the patient were reviewed by me and considered in my medical decision making (see chart for details).    Kathleen Mathews is a 31 y.o. female who presents to ED for urinary urgency/frequency, vaginal discharge and discomfort with urination x 3 days. UA nitrite + with TNTC white cells and large leuks. No fevers, chills, n/v or back pain. No CVA tenderness on exam. Doubt pyelonephritis. Will treat UTI with keflex. Pelvic exam performed showing small amount of white discharge. No cervical motion or adnexal tenderness. Wet prep shows clue cells and a few WBCs. Will treat with flagyl given symptomatic today. Results discussed with patient. She understands to return to  ER for fevers, vomiting, back pain, no improvement in 2-3 days, new/worsening/concerning symptoms. PCP or OBGYN follow up encouraged. Rx for Pyridium also given. All questions answered.   Final Clinical Impressions(s) / ED Diagnoses   Final diagnoses:  Acute cystitis with hematuria  Vaginal discharge    New Prescriptions New Prescriptions   CEPHALEXIN (KEFLEX) 500 MG CAPSULE    Take 1 capsule (500 mg total) by mouth 4 (four) times daily.   METRONIDAZOLE (FLAGYL) 500 MG TABLET    Take 1 tablet (500 mg total) by mouth 2 (two) times daily.   PHENAZOPYRIDINE (PYRIDIUM) 200 MG TABLET    Take 1 tablet (200 mg total) by mouth 3 (three) times daily as needed for pain.     [facility] Ward, PA-C 08/01/16 2440    Loren Racer, MD 08/02/16 [phone]

## 2016-08-01 NOTE — Discharge Instructions (Signed)
Stay very well hydrated with plenty of water throughout the day. Please take antibiotic until completion. Use pyridium as directed to decrease painful urination but know that a common side effect is to turn your urine a bright orange/red color. This is not a harmful side effect. Follow up with primary care physician in 1 week for recheck of ongoing symptoms. ° °Please seek immediate care if you develop the following: °Your symptoms are no better or worse in 3 days. °There is severe back pain or lower abdominal pain.  °You develop chills.  °You have a fever.  °There is nausea or vomiting.  °There is continued burning or discomfort with urination.  °You have any additional concerns.  °

## 2016-08-01 NOTE — ED Triage Notes (Signed)
She states she began to have vaginal bleeding "just like a period" by which she was surprised d/t having Mirena I.U.D. She  Had had a prior Mirena x 5 years; and this current one was placed 3 years ago. She c/o mild lower abd. Aching. She denies n/v/d/dysuria and is in no distress.

## 2016-08-03 LAB — GC/CHLAMYDIA PROBE AMP (~~LOC~~) NOT AT ARMC
Chlamydia: NEGATIVE
NEISSERIA GONORRHEA: NEGATIVE

## 2016-09-10 IMAGING — CR DG CHEST 2V
2 series · 2 of 2 positions shown · non-contrast
Comparison: 05/21/2009 chest radiograph

CLINICAL DATA: Chest pain.

EXAM:
CHEST  2 VIEW

[w chest pa]
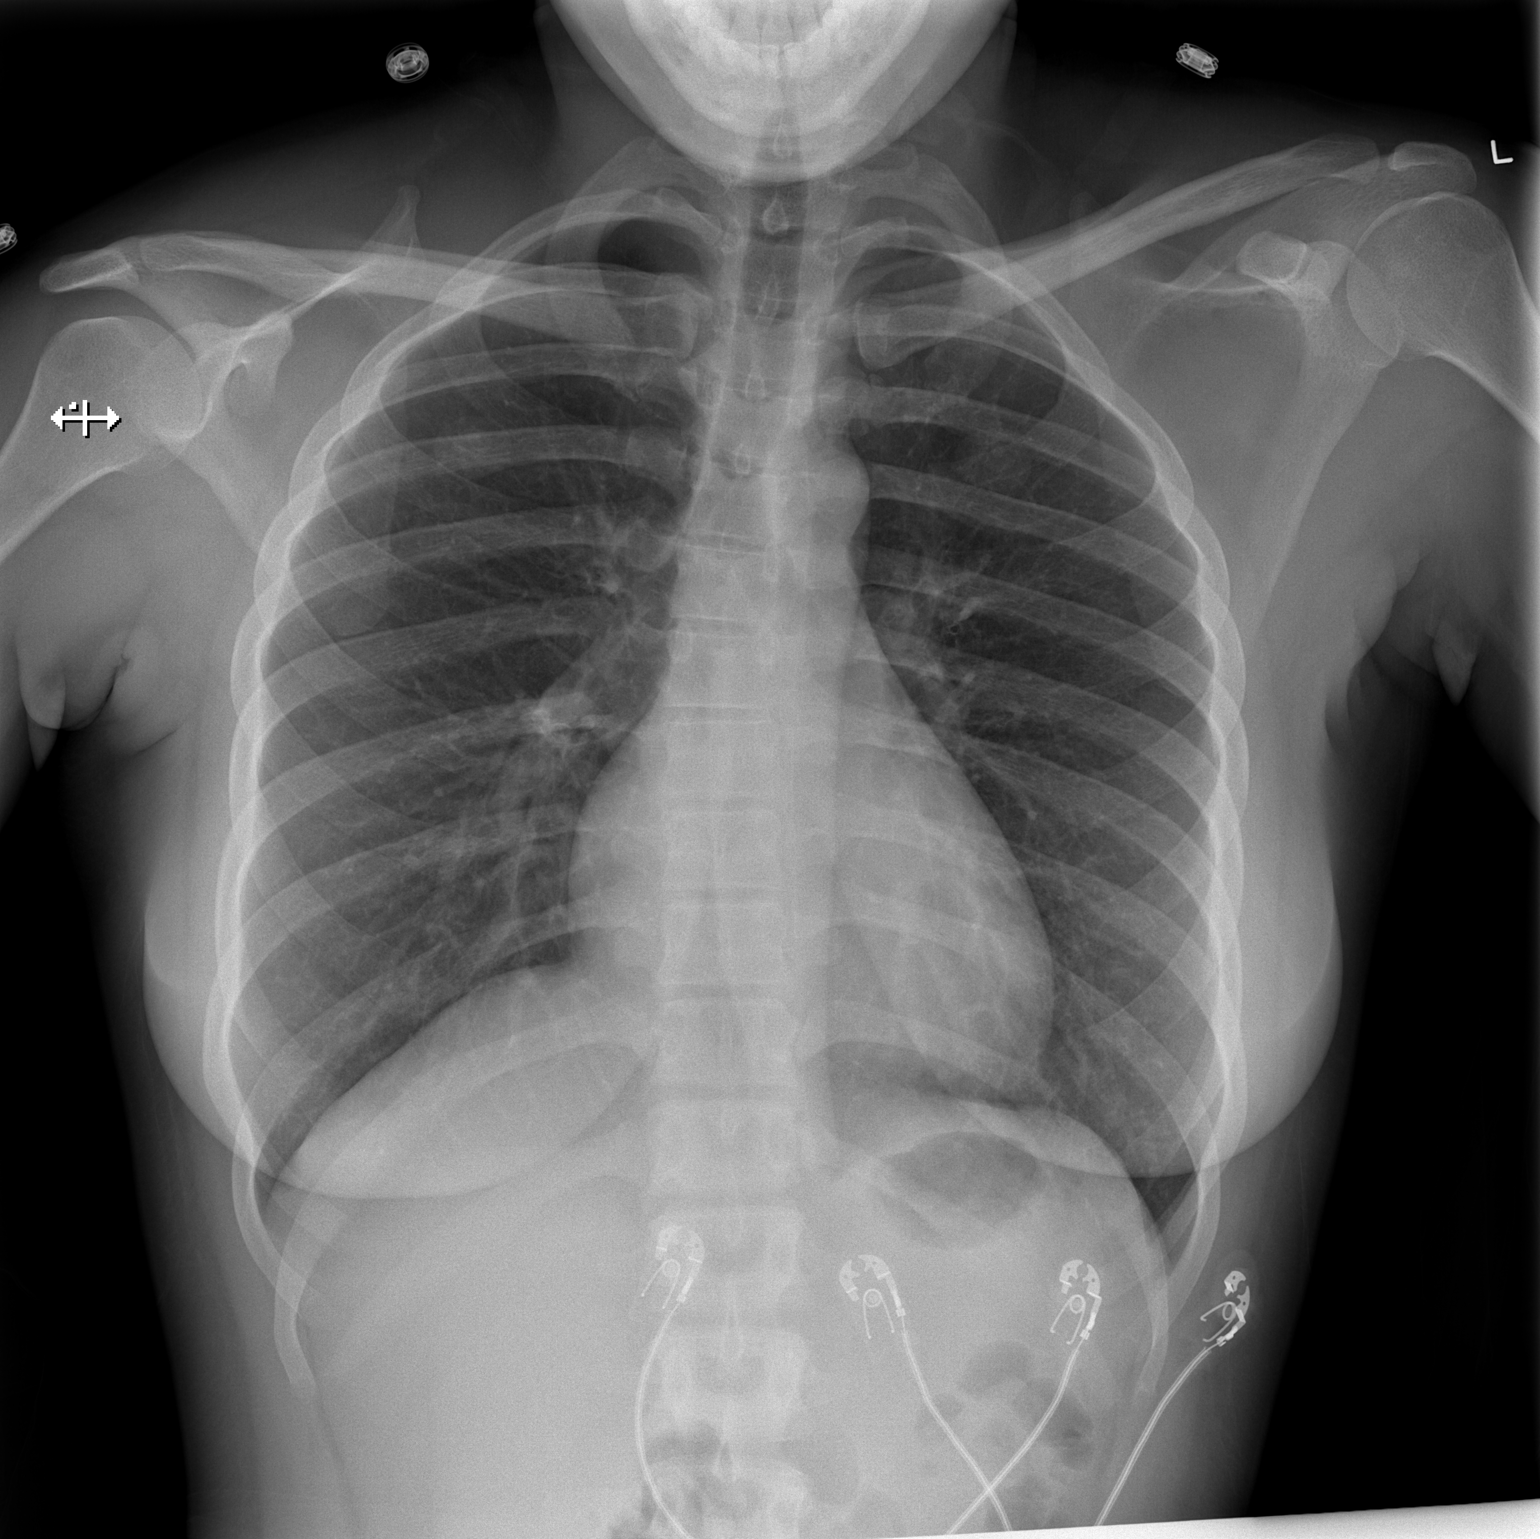

[w chest lat]
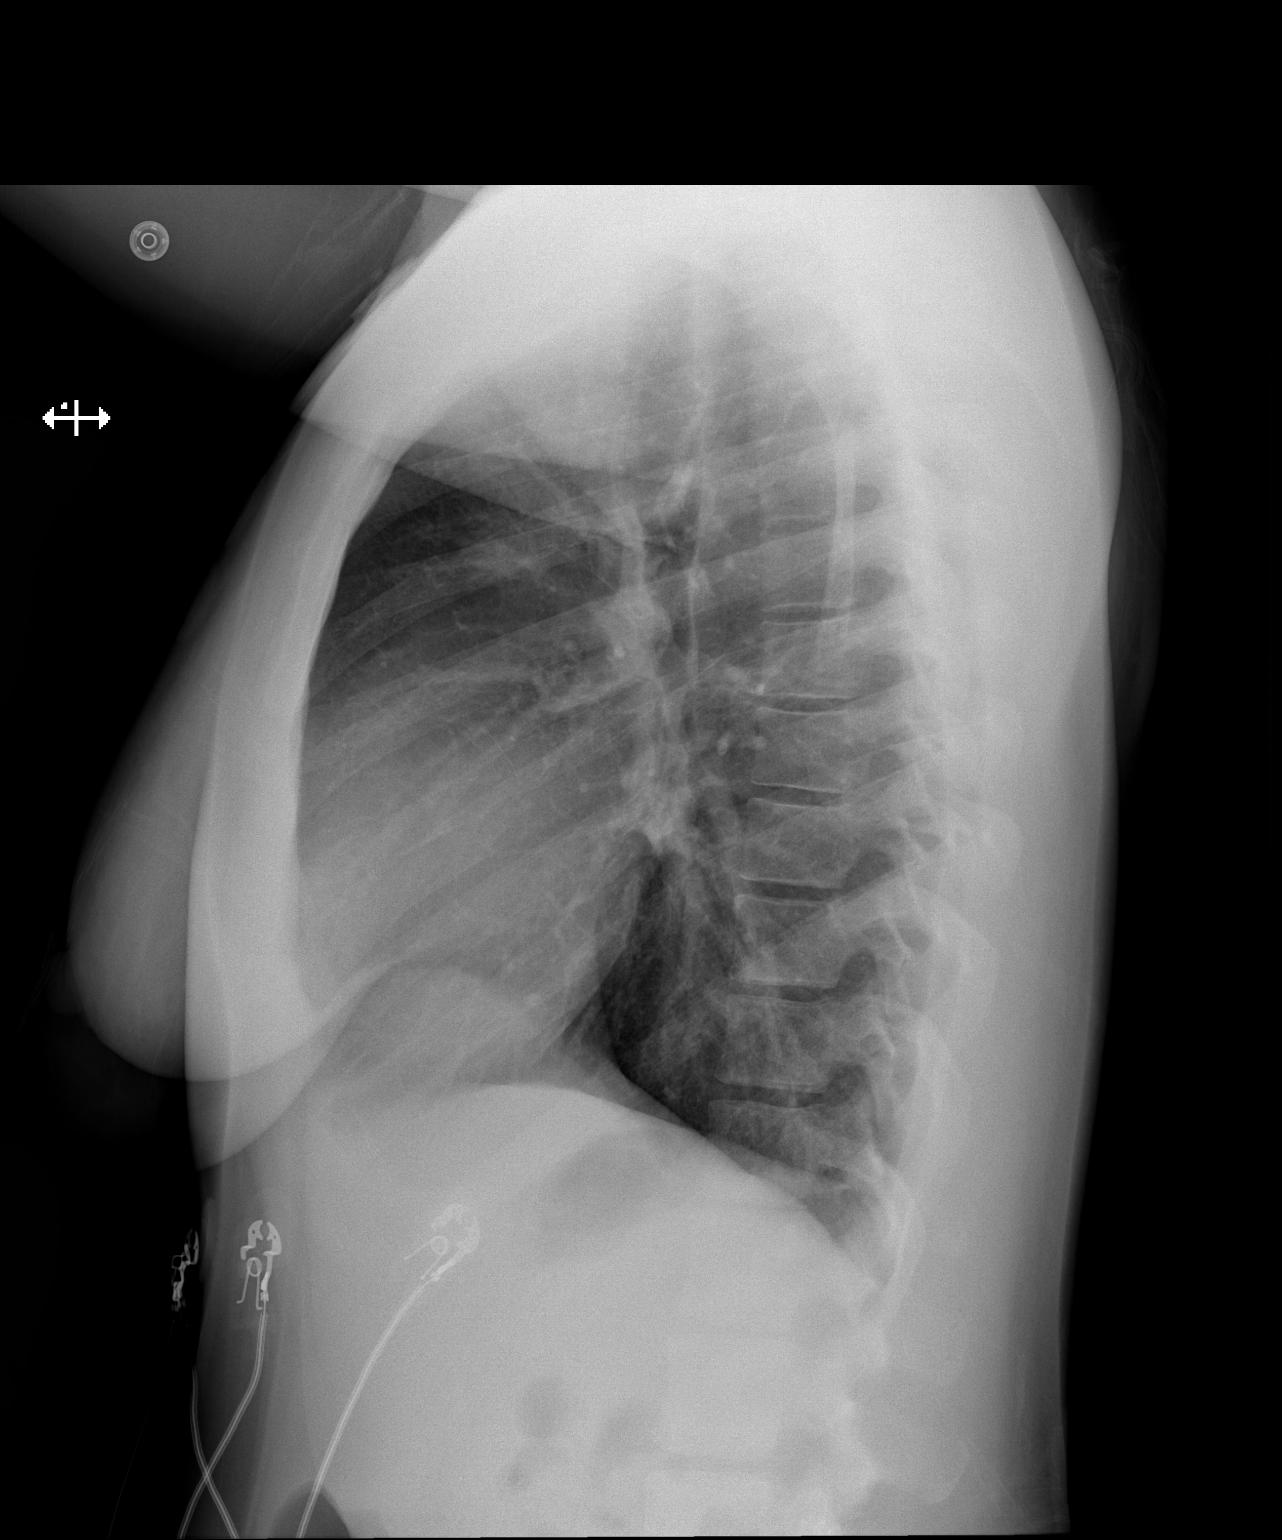

[2 of 2 positions shown; findings below may reference images not displayed]

FINDINGS: Stable cardiomediastinal silhouette with normal heart size. No
pneumothorax. No pleural effusion. Clear lungs, with no focal lung
consolidation and no pulmonary edema. Visualized osseous structures
appear intact.
IMPRESSION: No active disease in the chest.

## 2020-11-20 ENCOUNTER — Inpatient Hospital Stay (HOSPITAL_COMMUNITY)
Admission: EM | Admit: 2020-11-20 | Discharge: 2020-11-24 | DRG: 581 | Disposition: A | Discharge status: Home / Self Care | Payer: Self-pay | Attending: General Surgery | Admitting: General Surgery

## 2020-11-20 ENCOUNTER — Emergency Department (HOSPITAL_COMMUNITY): Payer: Self-pay

## 2020-11-20 ENCOUNTER — Encounter (HOSPITAL_COMMUNITY): Payer: Self-pay | Admitting: Emergency Medicine

## 2020-11-20 ENCOUNTER — Other Ambulatory Visit: Payer: Self-pay

## 2020-11-20 DIAGNOSIS — Z79899 Other long term (current) drug therapy: Secondary | ICD-10-CM

## 2020-11-20 DIAGNOSIS — K611 Rectal abscess: Secondary | ICD-10-CM | POA: Diagnosis present

## 2020-11-20 DIAGNOSIS — F17210 Nicotine dependence, cigarettes, uncomplicated: Secondary | ICD-10-CM | POA: Diagnosis present

## 2020-11-20 DIAGNOSIS — Z20822 Contact with and (suspected) exposure to covid-19: Secondary | ICD-10-CM | POA: Diagnosis present

## 2020-11-20 DIAGNOSIS — Z91040 Latex allergy status: Secondary | ICD-10-CM

## 2020-11-20 DIAGNOSIS — L03317 Cellulitis of buttock: Secondary | ICD-10-CM | POA: Diagnosis present

## 2020-11-20 DIAGNOSIS — Z6835 Body mass index (BMI) 35.0-35.9, adult: Secondary | ICD-10-CM

## 2020-11-20 DIAGNOSIS — E669 Obesity, unspecified: Secondary | ICD-10-CM | POA: Diagnosis present

## 2020-11-20 DIAGNOSIS — Z975 Presence of (intrauterine) contraceptive device: Secondary | ICD-10-CM

## 2020-11-20 DIAGNOSIS — L0231 Cutaneous abscess of buttock: Principal | ICD-10-CM | POA: Diagnosis present

## 2020-11-20 LAB — BASIC METABOLIC PANEL
Anion gap: 10 (ref 5–15)
BUN: 14 mg/dL (ref 6–20)
CO2: 26 mmol/L (ref 22–32)
Calcium: 9.4 mg/dL (ref 8.9–10.3)
Chloride: 101 mmol/L (ref 98–111)
Creatinine, Ser: 1.08 mg/dL — ABNORMAL HIGH (ref 0.44–1.00)
GFR, Estimated: >60 mL/min (ref >60)
Glucose, Bld: 101 mg/dL — ABNORMAL HIGH (ref 70–99)
Potassium: 3.5 mmol/L (ref 3.5–5.1)
Sodium: 137 mmol/L (ref 135–145)

## 2020-11-20 LAB — CBC WITH DIFFERENTIAL/PLATELET
Abs Immature Granulocytes: 0.27 10*3/uL — ABNORMAL HIGH (ref 0.00–0.07)
Basophils Absolute: 0.1 10*3/uL (ref 0.0–0.1)
Basophils Relative: 0 %
Eosinophils Absolute: 0.2 10*3/uL (ref 0.0–0.5)
Eosinophils Relative: 1 %
HCT: 40.4 % (ref 36.0–46.0)
Hemoglobin: 13.2 g/dL (ref 12.0–15.0)
Immature Granulocytes: 2 %
Lymphocytes Relative: 13 %
Lymphs Abs: 2.4 10*3/uL (ref 0.7–4.0)
MCH: 26.7 pg (ref 26.0–34.0)
MCHC: 32.7 g/dL (ref 30.0–36.0)
MCV: 81.6 fL (ref 80.0–100.0)
Monocytes Absolute: 1.6 10*3/uL — ABNORMAL HIGH (ref 0.1–1.0)
Monocytes Relative: 9 %
Neutro Abs: 13.6 10*3/uL — ABNORMAL HIGH (ref 1.7–7.7)
Neutrophils Relative %: 75 %
Platelets: 382 10*3/uL (ref 150–400)
RBC: 4.95 MIL/uL (ref 3.87–5.11)
RDW: 13.7 % (ref 11.5–15.5)
WBC: 18.1 10*3/uL — ABNORMAL HIGH (ref 4.0–10.5)
nRBC: 0 % (ref 0.0–0.2)

## 2020-11-20 LAB — I-STAT BETA HCG BLOOD, ED (MC, WL, AP ONLY): I-stat hCG, quantitative: <5 m[IU]/mL (ref <5)

## 2020-11-20 LAB — RESP PANEL BY RT-PCR (FLU A&B, COVID) ARPGX2
Influenza A by PCR: NEGATIVE
Influenza B by PCR: NEGATIVE
SARS Coronavirus 2 by RT PCR: NEGATIVE

## 2020-11-20 MED ORDER — SODIUM CHLORIDE 0.9 % IV BOLUS
1000.0000 mL | Freq: Once | INTRAVENOUS | Status: AC
  Administered 2020-11-20: 1000 mL via INTRAVENOUS

## 2020-11-20 MED ORDER — LIDOCAINE-EPINEPHRINE (PF) 2 %-1:200000 IJ SOLN
20.0000 mL | Freq: Once | INTRAMUSCULAR | Status: AC
  Administered 2020-11-20: 20 mL
  Filled 2020-11-20: qty 20

## 2020-11-20 MED ORDER — ONDANSETRON HCL 4 MG/2ML IJ SOLN
4.0000 mg | Freq: Once | INTRAMUSCULAR | Status: AC
  Administered 2020-11-20: 4 mg via INTRAVENOUS
  Filled 2020-11-20: qty 2

## 2020-11-20 MED ORDER — IOHEXOL 350 MG/ML SOLN
80.0000 mL | Freq: Once | INTRAVENOUS | Status: AC | PRN
  Administered 2020-11-20: 80 mL via INTRAVENOUS

## 2020-11-20 MED ORDER — MORPHINE SULFATE (PF) 4 MG/ML IV SOLN
4.0000 mg | Freq: Once | INTRAVENOUS | Status: DC
  Filled 2020-11-20: qty 1

## 2020-11-20 MED ORDER — CIPROFLOXACIN IN D5W 400 MG/200ML IV SOLN
400.0000 mg | Freq: Once | INTRAVENOUS | Status: AC
  Administered 2020-11-20: 400 mg via INTRAVENOUS
  Filled 2020-11-20: qty 200

## 2020-11-20 MED ORDER — METRONIDAZOLE 500 MG/100ML IV SOLN
500.0000 mg | Freq: Once | INTRAVENOUS | Status: AC
  Administered 2020-11-20: 500 mg via INTRAVENOUS
  Filled 2020-11-20: qty 100

## 2020-11-20 MED ORDER — ACETAMINOPHEN 325 MG PO TABS
650.0000 mg | ORAL_TABLET | Freq: Once | ORAL | Status: AC
  Administered 2020-11-20: 650 mg via ORAL
  Filled 2020-11-20: qty 2

## 2020-11-20 NOTE — ED Triage Notes (Signed)
Patient c/o rectal abscess worsening x2 weeks.

## 2020-11-20 NOTE — ED Notes (Signed)
Pt vomited. Pt ambulatory to bathroom

## 2020-11-20 NOTE — H&P (Signed)
CC: buttock pain  Requesting provider: Dr Charm Barges  HPI: Kathleen Mathews is an 35 y.o. female who is here for buttock pain.  No significant past medical history.  She states she had a pimple in her perirectal area that came up about 2 weeks ago.  She said it ruptured and was doing better and then it started to recur again.  She has been trying warm compresses without relief.  She has had on and off fevers and some nausea.  No prior history of same.    Past Medical History:  Diagnosis Date   Thyroid disease     Past Surgical History:  Procedure Laterality Date   INTRAUTERINE DEVICE INSERTION      Family History  Problem Relation Age of Onset   Hypertension Mother    Obesity Father     Social:  reports that she has been smoking cigarettes. She has been smoking an average of .25 packs per day. She has never used smokeless tobacco. She reports current alcohol use. She reports that she does not use drugs.  Allergies:  Allergies  Allergen Reactions   Latex Rash    Medications: I have reviewed the patient's current medications.  Results for orders placed or performed during the hospital encounter of 11/20/20 (from the past 48 hour(s))  Basic metabolic panel     Status: Abnormal   Collection Time: 11/20/20  5:58 PM  Result Value Ref Range   Sodium 137 135 - 145 mmol/L   Potassium 3.5 3.5 - 5.1 mmol/L   Chloride 101 98 - 111 mmol/L   CO2 26 22 - 32 mmol/L   Glucose, Bld 101 (H) 70 - 99 mg/dL    Comment: Glucose reference range applies only to samples taken after fasting for at least 8 hours.   BUN 14 6 - 20 mg/dL   Creatinine, Ser 1.70 (H) 0.44 - 1.00 mg/dL   Calcium 9.4 8.9 - 01.7 mg/dL   GFR, Estimated >49 >44 mL/min    Comment: (NOTE) Calculated using the CKD-EPI Creatinine Equation (2021)    Anion gap 10 5 - 15    Comment: Performed at [facility], [address] [place], [place] [address]  CBC with Differential     Status: Abnormal    Collection Time: 11/20/20  5:58 PM  Result Value Ref Range   WBC 18.1 (H) 4.0 - 10.5 K/uL   RBC 4.95 3.87 - 5.11 MIL/uL   Hemoglobin 13.2 12.0 - 15.0 g/dL   HCT 16.3 84.6 - 65.9 %   MCV 81.6 80.0 - 100.0 fL   MCH 26.7 26.0 - 34.0 pg   MCHC 32.7 30.0 - 36.0 g/dL   RDW 93.5 70.1 - 77.9 %   Platelets 382 150 - 400 K/uL   nRBC 0.0 0.0 - 0.2 %   Neutrophils Relative % 75 %   Neutro Abs 13.6 (H) 1.7 - 7.7 K/uL   Lymphocytes Relative 13 %   Lymphs Abs 2.4 0.7 - 4.0 K/uL   Monocytes Relative 9 %   Monocytes Absolute 1.6 (H) 0.1 - 1.0 K/uL   Eosinophils Relative 1 %   Eosinophils Absolute 0.2 0.0 - 0.5 K/uL   Basophils Relative 0 %   Basophils Absolute 0.1 0.0 - 0.1 K/uL   Immature Granulocytes 2 %   Abs Immature Granulocytes 0.27 (H) 0.00 - 0.07 K/uL    Comment: Performed at [facility], [address], [place], [place] [address]  I-Stat beta hCG  blood, ED     Status: None   Collection Time: 11/20/20  6:12 PM  Result Value Ref Range   I-stat hCG, quantitative <5.0 <5 mIU/mL   Comment 3            Comment:   GEST. AGE      CONC.  (mIU/mL)   <=1 WEEK        5 - 50     2 WEEKS       50 - 500     3 WEEKS       100 - 10,000     4 WEEKS     1,000 - 30,000        FEMALE AND NON-PREGNANT FEMALE:     LESS THAN 5 mIU/mL   Resp Panel by RT-PCR (Flu A&B, Covid) Nasopharyngeal Swab     Status: None   Collection Time: 11/20/20  6:23 PM   Specimen: Nasopharyngeal Swab; Nasopharyngeal(NP) swabs in vial transport medium  Result Value Ref Range   SARS Coronavirus 2 by RT PCR NEGATIVE NEGATIVE    Comment: (NOTE) SARS-CoV-2 target nucleic acids are NOT DETECTED.  The SARS-CoV-2 RNA is generally detectable in upper respiratory specimens during the acute phase of infection. The lowest concentration of SARS-CoV-2 viral copies this assay can detect is 138 copies/mL. A negative result does not preclude SARS-Cov-2 infection and should not be used as the sole basis for treatment  or other patient management decisions. A negative result may occur with  improper specimen collection/handling, submission of specimen other than nasopharyngeal swab, presence of viral mutation(s) within the areas targeted by this assay, and inadequate number of viral copies(<138 copies/mL). A negative result must be combined with clinical observations, patient history, and epidemiological information. The expected result is Negative.  Fact Sheet for Patients:  BloggerCourse.comhttps://www.fda.gov/media/152166/download  Fact Sheet for Healthcare Providers:  SeriousBroker.ithttps://www.fda.gov/media/152162/download  This test is no t yet approved or cleared by the [place] FDA and  has been authorized for detection and/or diagnosis of SARS-CoV-2 by FDA under an Emergency Use Authorization (EUA). This EUA will remain  in effect (meaning this test can be used) for the duration of the COVID-19 declaration under Section 564(b)(1) of the Act, 21 U.S.C.section 360bbb-3(b)(1), unless the authorization is terminated  or revoked sooner.       Influenza A by PCR NEGATIVE NEGATIVE   Influenza B by PCR NEGATIVE NEGATIVE    Comment: (NOTE) The Xpert Xpress SARS-CoV-2/FLU/RSV plus assay is intended as an aid in the diagnosis of influenza from Nasopharyngeal swab specimens and should not be used as a sole basis for treatment. Nasal washings and aspirates are unacceptable for Xpert Xpress SARS-CoV-2/FLU/RSV testing.  Fact Sheet for Patients: BloggerCourse.comhttps://www.fda.gov/media/152166/download  Fact Sheet for Healthcare Providers: SeriousBroker.ithttps://www.fda.gov/media/152162/download  This test is not yet approved or cleared by the [place] FDA and has been authorized for detection and/or diagnosis of SARS-CoV-2 by FDA under an Emergency Use Authorization (EUA). This EUA will remain in effect (meaning this test can be used) for the duration of the COVID-19 declaration under Section 564(b)(1) of the Act, 21 U.S.C. section  360bbb-3(b)(1), unless the authorization is terminated or revoked.  Performed at [facility], [address] [place], [place] [id number]     CT PELVIS W CONTRAST  Result Date: 11/20/2020 CLINICAL DATA:  Anorectal abscess. Patient reports worsening over the last 2 weeks. EXAM: CT PELVIS WITH CONTRAST TECHNIQUE: Multidetector CT imaging of the pelvis was performed using the standard protocol following  the bolus administration of intravenous contrast. Patient was scanned prone CONTRAST:  84mL OMNIPAQUE IOHEXOL 350 MG/ML SOLN COMPARISON:  None. FINDINGS: Urinary Tract: Decompressed distal ureters. Unremarkable urinary bladder. Bowel: Mild wall thickening in the in anorectum with questionable but not definitive tract to the gluteal abscess, for example series 2, image 42. Pelvic bowel loops are otherwise unremarkable Vascular/Lymphatic: Multiple prominent right inguinal lymph nodes are likely reactive. No acute vascular findings. Reproductive: IUD in the uterus which is deviated to the right pelvis. No adnexal mass. Other: Inflammatory changes in the right perineum and along the right gluteal crease with air-fluid collection. Subcutaneous collections spans approximately 5.4 x 4.4 x 5.9 cm, series 2, image 49. There is surrounding fat stranding, inflammation and foci of gas extending cranially towards the right aspect of the anorectum. Possible but not definite tract extending to the right in the rectum, series 2, image 42. There is skin thickening adjacent to the collection is well as involving the dependent right buttock soft tissues. No air tracks elsewhere. No additional pelvic fluid collection. Musculoskeletal: There are no acute or suspicious osseous abnormalities. Bone island in the right iliac. IMPRESSION: 1. Right gluteal/perineal subcutaneous abscess measuring 5.4 x 4.4 x 5.9 cm. There is surrounding fat stranding, inflammation and foci of gas extending cranially towards the right  aspect of the anorectum. Possible but not definite tract extending to the right aspect of the rectum. 2. Mild rectal wall thickening. 3. Prominent right inguinal lymph nodes are likely reactive. Electronically Signed   By: Narda Rutherford M.D.   On: 11/20/2020 19:51    ROS - all of the below systems have been reviewed with the patient and positives are indicated with bold text General: chills, fever or night sweats Eyes: blurry vision or double vision ENT: epistaxis or sore throat Allergy/Immunology: itchy/watery eyes or nasal congestion Hematologic/Lymphatic: bleeding problems, blood clots or swollen lymph nodes Endocrine: temperature intolerance or unexpected weight changes Breast: new or changing breast lumps or nipple discharge Resp: cough, shortness of breath, or wheezing CV: chest pain or dyspnea on exertion GI: as per HPI GU: dysuria, trouble voiding, or hematuria MSK: joint pain or joint stiffness Neuro: TIA or stroke symptoms Derm: pruritus and skin lesion changes Psych: anxiety and depression  PE Blood pressure (!) 88/58, pulse 83, temperature (!) 97.5 F (36.4 C), temperature source Oral, resp. rate 16, SpO2 97 %. Constitutional: NAD; conversant; no deformities Eyes: Moist conjunctiva; no lid lag; anicteric; PERRL Neck: Trachea midline; no thyromegaly Lungs: Normal respiratory effort; no tactile fremitus CV: RRR; no palpable thrills; no pitting edema GI: Abd soft; no palpable hepatosplenomegaly MSK: Normal range of motion of extremities; no clubbing/cyanosis Psychiatric: Appropriate affect; alert and oriented x3 Lymphatic: No palpable cervical or axillary lymphadenopathy  Results for orders placed or performed during the hospital encounter of 11/20/20 (from the past 48 hour(s))  Basic metabolic panel     Status: Abnormal   Collection Time: 11/20/20  5:58 PM  Result Value Ref Range   Sodium 137 135 - 145 mmol/L   Potassium 3.5 3.5 - 5.1 mmol/L   Chloride 101 98 - 111  mmol/L   CO2 26 22 - 32 mmol/L   Glucose, Bld 101 (H) 70 - 99 mg/dL    Comment: Glucose reference range applies only to samples taken after fasting for at least 8 hours.   BUN 14 6 - 20 mg/dL   Creatinine, Ser 5.63 (H) 0.44 - 1.00 mg/dL   Calcium 9.4 8.9 - 87.5 mg/dL  GFR, Estimated >60 >60 mL/min    Comment: (NOTE) Calculated using the CKD-EPI Creatinine Equation (2021)    Anion gap 10 5 - 15    Comment: Performed at [facility], [address], [place], [place] [address]  CBC with Differential     Status: Abnormal   Collection Time: 11/20/20  5:58 PM  Result Value Ref Range   WBC 18.1 (H) 4.0 - 10.5 K/uL   RBC 4.95 3.87 - 5.11 MIL/uL   Hemoglobin 13.2 12.0 - 15.0 g/dL   HCT 60.4 54.0 - 98.1 %   MCV 81.6 80.0 - 100.0 fL   MCH 26.7 26.0 - 34.0 pg   MCHC 32.7 30.0 - 36.0 g/dL   RDW 19.1 47.8 - 29.5 %   Platelets 382 150 - 400 K/uL   nRBC 0.0 0.0 - 0.2 %   Neutrophils Relative % 75 %   Neutro Abs 13.6 (H) 1.7 - 7.7 K/uL   Lymphocytes Relative 13 %   Lymphs Abs 2.4 0.7 - 4.0 K/uL   Monocytes Relative 9 %   Monocytes Absolute 1.6 (H) 0.1 - 1.0 K/uL   Eosinophils Relative 1 %   Eosinophils Absolute 0.2 0.0 - 0.5 K/uL   Basophils Relative 0 %   Basophils Absolute 0.1 0.0 - 0.1 K/uL   Immature Granulocytes 2 %   Abs Immature Granulocytes 0.27 (H) 0.00 - 0.07 K/uL    Comment: Performed at [facility], [address] [place], [place] [address]  I-Stat beta hCG blood, ED     Status: None   Collection Time: 11/20/20  6:12 PM  Result Value Ref Range   I-stat hCG, quantitative <5.0 <5 mIU/mL   Comment 3            Comment:   GEST. AGE      CONC.  (mIU/mL)   <=1 WEEK        5 - 50     2 WEEKS       50 - 500     3 WEEKS       100 - 10,000     4 WEEKS     1,000 - 30,000        FEMALE AND NON-PREGNANT FEMALE:     LESS THAN 5 mIU/mL   Resp Panel by RT-PCR (Flu A&B, Covid) Nasopharyngeal Swab     Status: None   Collection Time: 11/20/20  6:23  PM   Specimen: Nasopharyngeal Swab; Nasopharyngeal(NP) swabs in vial transport medium  Result Value Ref Range   SARS Coronavirus 2 by RT PCR NEGATIVE NEGATIVE    Comment: (NOTE) SARS-CoV-2 target nucleic acids are NOT DETECTED.  The SARS-CoV-2 RNA is generally detectable in upper respiratory specimens during the acute phase of infection. The lowest concentration of SARS-CoV-2 viral copies this assay can detect is 138 copies/mL. A negative result does not preclude SARS-Cov-2 infection and should not be used as the sole basis for treatment or other patient management decisions. A negative result may occur with  improper specimen collection/handling, submission of specimen other than nasopharyngeal swab, presence of viral mutation(s) within the areas targeted by this assay, and inadequate number of viral copies(<138 copies/mL). A negative result must be combined with clinical observations, patient history, and epidemiological information. The expected result is Negative.  Fact Sheet for Patients:  BloggerCourse.com  Fact Sheet for Healthcare Providers:  SeriousBroker.it  This test is no t yet approved or cleared by the [place] and  has been authorized for detection and/or diagnosis of SARS-CoV-2 by FDA under an Emergency Use Authorization (EUA). This EUA will remain  in effect (meaning this test can be used) for the duration of the COVID-19 declaration under Section 564(b)(1) of the Act, 21 U.S.C.section 360bbb-3(b)(1), unless the authorization is terminated  or revoked sooner.       Influenza A by PCR NEGATIVE NEGATIVE   Influenza B by PCR NEGATIVE NEGATIVE    Comment: (NOTE) The Xpert Xpress SARS-CoV-2/FLU/RSV plus assay is intended as an aid in the diagnosis of influenza from Nasopharyngeal swab specimens and should not be used as a sole basis for treatment. Nasal washings and aspirates are unacceptable for Xpert  Xpress SARS-CoV-2/FLU/RSV testing.  Fact Sheet for Patients: BloggerCourse.com  Fact Sheet for Healthcare Providers: SeriousBroker.it  This test is not yet approved or cleared by the [place] FDA and has been authorized for detection and/or diagnosis of SARS-CoV-2 by FDA under an Emergency Use Authorization (EUA). This EUA will remain in effect (meaning this test can be used) for the duration of the COVID-19 declaration under Section 564(b)(1) of the Act, 21 U.S.C. section 360bbb-3(b)(1), unless the authorization is terminated or revoked.  Performed at [facility], [address] [place], [place] [address]     CT PELVIS W CONTRAST  Result Date: 11/20/2020 CLINICAL DATA:  Anorectal abscess. Patient reports worsening over the last 2 weeks. EXAM: CT PELVIS WITH CONTRAST TECHNIQUE: Multidetector CT imaging of the pelvis was performed using the standard protocol following the bolus administration of intravenous contrast. Patient was scanned prone CONTRAST:  47mL OMNIPAQUE IOHEXOL 350 MG/ML SOLN COMPARISON:  None. FINDINGS: Urinary Tract: Decompressed distal ureters. Unremarkable urinary bladder. Bowel: Mild wall thickening in the in anorectum with questionable but not definitive tract to the gluteal abscess, for example series 2, image 42. Pelvic bowel loops are otherwise unremarkable Vascular/Lymphatic: Multiple prominent right inguinal lymph nodes are likely reactive. No acute vascular findings. Reproductive: IUD in the uterus which is deviated to the right pelvis. No adnexal mass. Other: Inflammatory changes in the right perineum and along the right gluteal crease with air-fluid collection. Subcutaneous collections spans approximately 5.4 x 4.4 x 5.9 cm, series 2, image 49. There is surrounding fat stranding, inflammation and foci of gas extending cranially towards the right aspect of the anorectum. Possible but not  definite tract extending to the right in the rectum, series 2, image 42. There is skin thickening adjacent to the collection is well as involving the dependent right buttock soft tissues. No air tracks elsewhere. No additional pelvic fluid collection. Musculoskeletal: There are no acute or suspicious osseous abnormalities. Bone island in the right iliac. IMPRESSION: 1. Right gluteal/perineal subcutaneous abscess measuring 5.4 x 4.4 x 5.9 cm. There is surrounding fat stranding, inflammation and foci of gas extending cranially towards the right aspect of the anorectum. Possible but not definite tract extending to the right aspect of the rectum. 2. Mild rectal wall thickening. 3. Prominent right inguinal lymph nodes are likely reactive. Electronically Signed   By: Narda Rutherford M.D.   On: 11/20/2020 19:51     A/P: Catheryn Slifer Mathews is an 35 y.o. female with a 5cm buttock abscess.  She has underwent I&D in ED.  We will admit for IV abx due to elevated wbc and systemic symptoms.  Will monitor wound for need for further debridement.    Romie Levee, MD [facility] Use [email or web address] to contact on call provider

## 2020-11-20 NOTE — ED Provider Notes (Signed)
[facility]-EMERGENCY DEPT Provider Note   CSN: [id number] Arrival date & time: 11/20/20  1512     History Chief Complaint  Patient presents with   Abscess    Kathleen Mathews is a 35 y.o. female.  No significant past medical history.  She is complaining of a pimple in her perirectal area that came up about 2 weeks ago.  She said it ruptured and was doing better and then it started to recur again.  She has been trying warm compresses without relief.  She has had on and off fevers and some nausea.  No prior history of same.  Still moving her bowels normally.  Denies any chance of pregnancy.  The history is provided by the patient.  Abscess Location:  Pelvis Pelvic abscess location:  Perianal Size:  10 Abscess quality: fluctuance, induration, painful and redness   Abscess quality: not draining   Duration:  2 weeks Progression:  Worsening Pain details:    Quality:  Throbbing   Severity:  Moderate   Progression:  Worsening Chronicity:  New Context: not diabetes   Relieved by:  Nothing Worsened by:  Warm compresses Ineffective treatments:  Warm compresses Associated symptoms: fever, nausea and vomiting (once)   Associated symptoms: no headaches   Risk factors: no prior abscess       Past Medical History:  Diagnosis Date   Thyroid disease     There are no problems to display for this patient.   Past Surgical History:  Procedure Laterality Date   INTRAUTERINE DEVICE INSERTION       OB History   No obstetric history on file.     Family History  Problem Relation Age of Onset   Hypertension Mother    Obesity Father     Social History   Tobacco Use   Smoking status: Some Days    Packs/day: 0.25    Types: Cigarettes   Smokeless tobacco: Never  Substance Use Topics   Alcohol use: Yes    Comment: socially   Drug use: No    Home Medications Prior to Admission medications   Medication Sig Start Date End Date Taking?  Authorizing Provider  cephALEXin (KEFLEX) 500 MG capsule Take 1 capsule (500 mg total) by mouth 4 (four) times daily. 08/01/16   Ward, Chase Picket, PA-C  doxycycline (VIBRA-TABS) 100 MG tablet Take 1 tablet (100 mg total) by mouth 2 (two) times daily. Patient not taking: Reported on 02/03/2015 10/14/14   Arthor Captain, PA-C  loratadine (ALLERGY RELIEF) 10 MG tablet Take 10 mg by mouth daily as needed for allergies.    [provider]  meclizine (ANTIVERT) 32 MG tablet Take 1 tablet (32 mg total) by mouth 3 (three) times daily as needed. Patient taking differently: Take 32 mg by mouth 3 (three) times daily as needed for nausea.  02/03/15   Barrett, Rolm Gala, PA-C  metroNIDAZOLE (FLAGYL) 500 MG tablet Take 1 tablet (500 mg total) by mouth 2 (two) times daily. 08/01/16   Ward, Chase Picket, PA-C  oxyCODONE-acetaminophen (PERCOCET) 5-325 MG per tablet Take 1-2 tablets by mouth every 4 (four) hours as needed. Patient not taking: Reported on 02/03/2015 10/14/14   Arthor Captain, PA-C  phenazopyridine (PYRIDIUM) 200 MG tablet Take 1 tablet (200 mg total) by mouth 3 (three) times daily as needed for pain. 08/01/16   Ward, Chase Picket, PA-C  promethazine (PHENERGAN) 25 MG tablet Take 1 tablet (25 mg total) by mouth every 6 (six) hours as needed for  nausea or vomiting. 03/20/15   Gilda Crease, MD    Allergies    Latex  Review of Systems   Review of Systems  Constitutional:  Positive for fever.  HENT:  Negative for sore throat.   Eyes:  Negative for visual disturbance.  Respiratory:  Negative for shortness of breath.   Cardiovascular:  Negative for chest pain.  Gastrointestinal:  Positive for nausea and vomiting (once). Negative for abdominal pain.  Genitourinary:  Negative for dysuria.  Musculoskeletal:  Negative for back pain.  Skin:  Negative for rash.  Neurological:  Negative for headaches.   Physical Exam Updated Vital Signs BP 111/61 (BP Location: Right Arm)   Pulse (!)  145   Temp (!) 97.5 F (36.4 C) (Oral)   Resp 20   SpO2 99%   Physical Exam Vitals and nursing note reviewed.  Constitutional:      General: She is not in acute distress.    Appearance: Normal appearance. She is well-developed.  HENT:     Head: Normocephalic and atraumatic.  Eyes:     Conjunctiva/sclera: Conjunctivae normal.  Cardiovascular:     Rate and Rhythm: Regular rhythm. Tachycardia present.     Heart sounds: No murmur heard. Pulmonary:     Effort: Pulmonary effort is normal. No respiratory distress.     Breath sounds: Normal breath sounds.  Abdominal:     Palpations: Abdomen is soft.     Tenderness: There is no abdominal tenderness.  Genitourinary:    Comments: Exam done with tech chaperone.  She has a large tender fluctuant mass on her right gluteus extending down towards her perianal region.  Overlying erythema. Musculoskeletal:        General: No deformity or signs of injury. Normal range of motion.     Cervical back: Neck supple.  Skin:    General: Skin is warm and dry.  Neurological:     General: No focal deficit present.     Mental Status: She is alert.    ED Results / Procedures / Treatments   Labs (all labs ordered are listed, but only abnormal results are displayed) Labs Reviewed  BASIC METABOLIC PANEL - Abnormal; Notable for the following components:      Result Value   Glucose, Bld 101 (*)    Creatinine, Ser 1.08 (*)    All other components within normal limits  CBC WITH DIFFERENTIAL/PLATELET - Abnormal; Notable for the following components:   WBC 18.1 (*)    Neutro Abs 13.6 (*)    Monocytes Absolute 1.6 (*)    Abs Immature Granulocytes 0.27 (*)    All other components within normal limits  CBC - Abnormal; Notable for the following components:   WBC 21.2 (*)    Hemoglobin 11.2 (*)    HCT 34.3 (*)    All other components within normal limits  RESP PANEL BY RT-PCR (FLU A&B, COVID) ARPGX2  HIV ANTIBODY (ROUTINE TESTING W REFLEX)  I-STAT BETA  HCG BLOOD, ED ([facility], [facility], [facility] ONLY)    EKG None  Radiology CT PELVIS W CONTRAST  Result Date: 11/20/2020 CLINICAL DATA:  Anorectal abscess. Patient reports worsening over the last 2 weeks. EXAM: CT PELVIS WITH CONTRAST TECHNIQUE: Multidetector CT imaging of the pelvis was performed using the standard protocol following the bolus administration of intravenous contrast. Patient was scanned prone CONTRAST:  43mL OMNIPAQUE IOHEXOL 350 MG/ML SOLN COMPARISON:  None. FINDINGS: Urinary Tract: Decompressed distal ureters. Unremarkable urinary bladder. Bowel: Mild wall thickening in the  in anorectum with questionable but not definitive tract to the gluteal abscess, for example series 2, image 42. Pelvic bowel loops are otherwise unremarkable Vascular/Lymphatic: Multiple prominent right inguinal lymph nodes are likely reactive. No acute vascular findings. Reproductive: IUD in the uterus which is deviated to the right pelvis. No adnexal mass. Other: Inflammatory changes in the right perineum and along the right gluteal crease with air-fluid collection. Subcutaneous collections spans approximately 5.4 x 4.4 x 5.9 cm, series 2, image 49. There is surrounding fat stranding, inflammation and foci of gas extending cranially towards the right aspect of the anorectum. Possible but not definite tract extending to the right in the rectum, series 2, image 42. There is skin thickening adjacent to the collection is well as involving the dependent right buttock soft tissues. No air tracks elsewhere. No additional pelvic fluid collection. Musculoskeletal: There are no acute or suspicious osseous abnormalities. Bone island in the right iliac. IMPRESSION: 1. Right gluteal/perineal subcutaneous abscess measuring 5.4 x 4.4 x 5.9 cm. There is surrounding fat stranding, inflammation and foci of gas extending cranially towards the right aspect of the anorectum. Possible but not definite tract extending to the right aspect of the rectum. 2.  Mild rectal wall thickening. 3. Prominent right inguinal lymph nodes are likely reactive. Electronically Signed   By: Narda Rutherford M.D.   On: 11/20/2020 19:51    Procedures .Marland KitchenIncision and Drainage  Date/Time: 11/20/2020 8:38 PM Performed by: Terrilee Files, MD Authorized by: Terrilee Files, MD   Consent:    Consent obtained:  Verbal   Consent given by:  Patient   Risks discussed:  Bleeding, incomplete drainage, pain and infection   Alternatives discussed:  No treatment, delayed treatment and referral Universal protocol:    Procedure explained and questions answered to patient or proxy's satisfaction: yes     Patient identity confirmed:  Verbally with patient Location:    Type:  Abscess   Size:  6   Location:  Anogenital   Anogenital location:  Perianal Pre-procedure details:    Skin preparation:  Povidone-iodine Sedation:    Sedation type:  None Anesthesia:    Anesthesia method:  Local infiltration Procedure type:    Complexity:  Complex Procedure details:    Incision types:  Single straight   Wound management:  Probed and deloculated   Drainage:  Purulent   Drainage amount:  Copious   Packing materials:  1/2 in iodoform gauze Post-procedure details:    Procedure completion:  Tolerated well, no immediate complications   Medications Ordered in ED Medications  morphine 4 MG/ML injection 4 mg (4 mg Intravenous Not Given 11/20/20 1749)  enoxaparin (LOVENOX) injection 40 mg (40 mg Subcutaneous Given 11/21/20 0921)  dextrose 5 % and 0.45 % NaCl with KCl 20 mEq/L infusion ( Intravenous New Bag/Given 11/21/20 0328)  metroNIDAZOLE (FLAGYL) IVPB 500 mg (0 mg Intravenous Stopped 11/21/20 0450)  acetaminophen (TYLENOL) tablet 1,000 mg (1,000 mg Oral Given 11/21/20 0920)  diphenhydrAMINE (BENADRYL) capsule 25 mg (has no administration in time range)    Or  diphenhydrAMINE (BENADRYL) injection 25 mg (has no administration in time range)  traMADol (ULTRAM) tablet 50 mg (has no  administration in time range)  oxyCODONE (Oxy IR/ROXICODONE) immediate release tablet 5 mg (has no administration in time range)  ciprofloxacin (CIPRO) IVPB 400 mg (400 mg Intravenous New Bag/Given 11/21/20 0924)  sodium chloride 0.9 % bolus 1,000 mL (0 mLs Intravenous Stopped 11/20/20 2000)  acetaminophen (TYLENOL) tablet 650 mg (650 mg Oral  Given 11/20/20 1824)  iohexol (OMNIPAQUE) 350 MG/ML injection 80 mL (80 mLs Intravenous Contrast Given 11/20/20 1915)  lidocaine-EPINEPHrine (XYLOCAINE W/EPI) 2 %-1:200000 (PF) injection 20 mL (20 mLs Infiltration Given 11/20/20 2023)  ciprofloxacin (CIPRO) IVPB 400 mg (0 mg Intravenous Stopped 11/20/20 2259)  metroNIDAZOLE (FLAGYL) IVPB 500 mg (0 mg Intravenous Stopped 11/20/20 2142)  sodium chloride 0.9 % bolus 1,000 mL (0 mLs Intravenous Stopped 11/20/20 2142)  ondansetron (ZOFRAN) injection 4 mg (4 mg Intravenous Given 11/20/20 2308)    ED Course  I have reviewed the triage vital signs and the nursing notes.  Pertinent labs & imaging results that were available during my care of the patient were reviewed by me and considered in my medical decision making (see chart for details).  Clinical Course as of 11/21/20 0935  Wed Nov 20, 2020  2005 Discussed with Dr. Maisie Fushomas general surgery.  She recommends Cipro Flagyl and local I&D.  She will review the CAT scan and get back to me with plan. [MB]  2310 I have not heard back from Dr. Maisie Fushomas but there is an H&P in the chart.  Assume general surgery is admitting the patient. [MB]    Clinical Course User Index [MB] Terrilee FilesButler, Asuncion Tapscott C, MD   MDM Rules/Calculators/A&P                          This patient complains of abscess right gluteal and perianal area; this involves an extensive number of treatment Options and is a complaint that carries with it a high risk of complications and Morbidity. The differential includes perirectal abscess, gluteal abscess, ischio rectal abscess, deep space abscess, cellulitis  I  ordered, reviewed and interpreted labs, which included CBC with elevated white count normal hemoglobin, chemistries fairly normal, pregnancy test negative I ordered medication IV fluids and pain medication, IV antibiotics I ordered imaging studies which included CT pelvis with contrast and I independently    visualized and interpreted imaging which showed large perirectal abscess Previous records obtained and reviewed in epic no recent admissions I consulted Dr. Maisie Fushomas general surgery and discussed lab and imaging findings  Critical Interventions: None  After the interventions stated above, I reevaluated the patient and found patient's pain to be controlled and she symptomatically feels better.  Plan from general surgery as they are admitting her to their service for continued management of her abscess.   Final Clinical Impression(s) / ED Diagnoses Final diagnoses:  Perirectal abscess    Rx / DC Orders ED Discharge Orders     None        Terrilee FilesButler, Torion Hulgan C, MD 11/21/20 [phone]

## 2020-11-20 NOTE — ED Notes (Signed)
Dr. Charm Barges notified of pt vomiting. Medication requested

## 2020-11-20 NOTE — ED Notes (Signed)
Pt to CT via stretcher

## 2020-11-20 NOTE — ED Provider Notes (Signed)
Emergency Medicine Provider Triage Evaluation Note  Marlissa S Warner-McCurter , a 35 y.o. female  was evaluated in triage.  Pt complains of pain to the right buttock.  Patient states that about 2 weeks ago she began developing an abscess in the region which ruptured and alleviated.  Few days later she began developing a new 1 that is continued to worsen for the past 2 weeks.  She has been applying warm compresses with little relief.  Reports a fever about 1.5 weeks ago but denies any recent fevers.  No nausea, vomiting, or urinary complaints.  Physical Exam  BP 111/61 (BP Location: Right Arm)   Pulse (!) 145   Temp (!) 97.5 F (36.4 C) (Oral)   Resp 20   SpO2 99%  Gen:   Awake, no distress   Resp:  Normal effort  MSK:   Moves extremities without difficulty  Other:  Female nursing chaperone present.  5 to 6 cm region of erythema and induration noted to the right buttock.  Medical Decision Making  Medically screening exam initiated at 3:29 PM.  Appropriate orders placed.  Donabelle S Warner-McCurter was informed that the remainder of the evaluation will be completed by another provider, this initial triage assessment does not replace that evaluation, and the importance of remaining in the ED until their evaluation is complete.   Placido Sou, PA-C 11/20/20 1530    Little, Ambrose Finland, MD 11/20/20 1620

## 2020-11-21 ENCOUNTER — Encounter (HOSPITAL_COMMUNITY): Payer: Self-pay

## 2020-11-21 DIAGNOSIS — K611 Rectal abscess: Secondary | ICD-10-CM | POA: Diagnosis present

## 2020-11-21 DIAGNOSIS — L0231 Cutaneous abscess of buttock: Secondary | ICD-10-CM | POA: Diagnosis present

## 2020-11-21 LAB — CBC
HCT: 34.3 % — ABNORMAL LOW (ref 36.0–46.0)
Hemoglobin: 11.2 g/dL — ABNORMAL LOW (ref 12.0–15.0)
MCH: 26.7 pg (ref 26.0–34.0)
MCHC: 32.7 g/dL (ref 30.0–36.0)
MCV: 81.7 fL (ref 80.0–100.0)
Platelets: 360 10*3/uL (ref 150–400)
RBC: 4.2 MIL/uL (ref 3.87–5.11)
RDW: 13.7 % (ref 11.5–15.5)
WBC: 21.2 10*3/uL — ABNORMAL HIGH (ref 4.0–10.5)
nRBC: 0 % (ref 0.0–0.2)

## 2020-11-21 LAB — HIV ANTIBODY (ROUTINE TESTING W REFLEX): HIV Screen 4th Generation wRfx: NONREACTIVE

## 2020-11-21 MED ORDER — DIPHENHYDRAMINE HCL 25 MG PO CAPS: 25.0000 mg | ORAL_CAPSULE | Freq: Four times a day (QID) | ORAL | Status: DC | PRN

## 2020-11-21 MED ORDER — CIPROFLOXACIN IN D5W 400 MG/200ML IV SOLN: 400.0000 mg | Freq: Two times a day (BID) | INTRAVENOUS | Status: DC

## 2020-11-21 MED ORDER — ENOXAPARIN SODIUM 40 MG/0.4ML IJ SOSY
40.0000 mg | PREFILLED_SYRINGE | INTRAMUSCULAR | Status: DC
  Administered 2020-11-21 – 2020-11-23 (×3): 40 mg via SUBCUTANEOUS
  Filled 2020-11-21 (×3): qty 0.4

## 2020-11-21 MED ORDER — TRAMADOL HCL 50 MG PO TABS
50.0000 mg | ORAL_TABLET | Freq: Four times a day (QID) | ORAL | Status: DC | PRN
  Administered 2020-11-21: 50 mg via ORAL
  Filled 2020-11-21 (×2): qty 1

## 2020-11-21 MED ORDER — METRONIDAZOLE 500 MG/100ML IV SOLN
500.0000 mg | Freq: Three times a day (TID) | INTRAVENOUS | Status: DC
  Administered 2020-11-21 – 2020-11-24 (×10): 500 mg via INTRAVENOUS
  Filled 2020-11-21 (×10): qty 100

## 2020-11-21 MED ORDER — OXYCODONE HCL 5 MG PO TABS
5.0000 mg | ORAL_TABLET | ORAL | Status: DC | PRN
  Administered 2020-11-21 (×2): 5 mg via ORAL
  Filled 2020-11-21 (×2): qty 1

## 2020-11-21 MED ORDER — KCL IN DEXTROSE-NACL 20-5-0.45 MEQ/L-%-% IV SOLN
INTRAVENOUS | Status: DC
  Filled 2020-11-21 (×4): qty 1000

## 2020-11-21 MED ORDER — CIPROFLOXACIN IN D5W 400 MG/200ML IV SOLN
400.0000 mg | Freq: Two times a day (BID) | INTRAVENOUS | Status: DC
  Administered 2020-11-21 – 2020-11-23 (×6): 400 mg via INTRAVENOUS
  Filled 2020-11-21 (×6): qty 200

## 2020-11-21 MED ORDER — ACETAMINOPHEN 500 MG PO TABS
1000.0000 mg | ORAL_TABLET | Freq: Four times a day (QID) | ORAL | Status: DC
  Administered 2020-11-21 – 2020-11-24 (×7): 1000 mg via ORAL
  Filled 2020-11-21 (×11): qty 2

## 2020-11-21 MED ORDER — DIPHENHYDRAMINE HCL 50 MG/ML IJ SOLN: 25.0000 mg | Freq: Four times a day (QID) | INTRAMUSCULAR | Status: DC | PRN

## 2020-11-21 NOTE — Plan of Care (Signed)
  Problem: Education: Goal: Required Educational Video(s) Outcome: Not Applicable   Problem: Clinical Measurements: Goal: Postoperative complications will be avoided or minimized Outcome: Not Applicable   Problem: Skin Integrity: Goal: Demonstration of wound healing without infection will improve Outcome: Not Applicable

## 2020-11-21 NOTE — ED Notes (Signed)
Pt wound packed with packing strip by this RN and Abby W RN. Bandage placed over site.

## 2020-11-21 NOTE — ED Notes (Signed)
Pt resting in bed quietly at present. No needs voiced.  

## 2020-11-21 NOTE — Progress Notes (Signed)
    CC: Rectal pain  Subjective: Patient feels much better this AM.  Right buttocks with I&D and some packing in place.  Very purulent dark brown drainage coming from the wound.  The incision is about 2 cm long, and I can probe it to about 3-1/2 cm.  No loculated areas were noted.  The skin is macerated around the I&D site.  She may lose some of this.  Objective: Vital signs in last 24 hours: Temp:  [97.5 F (36.4 C)] 97.5 F (36.4 C) (07/20 1521) Pulse Rate:  [64-145] 64 (07/21 0400) Resp:  [15-20] 16 (07/21 0400) BP: (88-121)/(51-62) 106/62 (07/21 0400) SpO2:  [96 %-100 %] 99 % (07/21 0400)  Afebrile, VSS WBC 18.1>>21.2 Respiratory panel - neg  Intake/Output from previous day: 07/20 0701 - 07/21 0700 In: 2400 [IV Piggyback:2400] Out: -  Intake/Output this shift: No intake/output data recorded.  General appearance: alert, cooperative, and no distress Resp: clear to auscultation bilaterally Right buttocks: Right buttocks with I&D and some packing in place.  Very purulent dark brown drainage coming from the wound.  The incision is about 2 cm long, and I can probe it to about 3-1/2 cm.  No loculated areas were noted.  The skin is macerated around the I&D site.  She may lose some of this.   Lab Results:  Recent Labs    11/20/20 1758 11/21/20 0320  WBC 18.1* 21.2*  HGB 13.2 11.2*  HCT 40.4 34.3*  PLT 382 360    BMET Recent Labs    11/20/20 1758  NA 137  K 3.5  CL 101  CO2 26  GLUCOSE 101*  BUN 14  CREATININE 1.08*  CALCIUM 9.4   PT/INR No results for input(s): LABPROT, INR in the last 72 hours.  No results for input(s): AST, ALT, ALKPHOS, BILITOT, PROT, ALBUMIN in the last 168 hours.   Lipase     Component Value Date/Time   LIPASE 26 03/20/2015 0339     Medications:  acetaminophen  1,000 mg Oral Q6H   enoxaparin (LOVENOX) injection  40 mg Subcutaneous Q24H    morphine injection  4 mg Intravenous Once    ciprofloxacin     dextrose 5 % and 0.45 %  NaCl with KCl 20 mEq/L 50 mL/hr at 11/21/20 0328   metronidazole Stopped (11/21/20 0450)     Assessment/Plan   5 cm buttocks abscess I&D 11/20/20 ED   - plan Sitz bath qid, replace packing with dressing change or shower.  Continue antibiotics, recheck labs in AM.  NPO after MN and re-evaluate site in AM.  FEN: regular diet/IV fluids ID:  Cipro/Flagyl 7/20>> day 2 DVT:  Lovenox F/U:  [facility] - appt requested       LOS: 0 days    Kathleen Mathews 11/21/2020 Please see Amion

## 2020-11-22 LAB — CBC
HCT: 33.9 % — ABNORMAL LOW (ref 36.0–46.0)
Hemoglobin: 11.1 g/dL — ABNORMAL LOW (ref 12.0–15.0)
MCH: 26.9 pg (ref 26.0–34.0)
MCHC: 32.7 g/dL (ref 30.0–36.0)
MCV: 82.1 fL (ref 80.0–100.0)
Platelets: 360 10*3/uL (ref 150–400)
RBC: 4.13 MIL/uL (ref 3.87–5.11)
RDW: 13.9 % (ref 11.5–15.5)
WBC: 13.7 10*3/uL — ABNORMAL HIGH (ref 4.0–10.5)
nRBC: 0 % (ref 0.0–0.2)

## 2020-11-22 LAB — COMPREHENSIVE METABOLIC PANEL
ALT: 9 U/L (ref 0–44)
AST: 9 U/L — ABNORMAL LOW (ref 15–41)
Albumin: 2.9 g/dL — ABNORMAL LOW (ref 3.5–5.0)
Alkaline Phosphatase: 41 U/L (ref 38–126)
Anion gap: 4 — ABNORMAL LOW (ref 5–15)
BUN: 7 mg/dL (ref 6–20)
CO2: 26 mmol/L (ref 22–32)
Calcium: 8.5 mg/dL — ABNORMAL LOW (ref 8.9–10.3)
Chloride: 108 mmol/L (ref 98–111)
Creatinine, Ser: 0.73 mg/dL (ref 0.44–1.00)
GFR, Estimated: >60 mL/min (ref >60)
Glucose, Bld: 111 mg/dL — ABNORMAL HIGH (ref 70–99)
Potassium: 3.6 mmol/L (ref 3.5–5.1)
Sodium: 138 mmol/L (ref 135–145)
Total Bilirubin: 0.4 mg/dL (ref 0.3–1.2)
Total Protein: 6.1 g/dL — ABNORMAL LOW (ref 6.5–8.1)

## 2020-11-22 MED ORDER — ONDANSETRON HCL 4 MG/2ML IJ SOLN
4.0000 mg | Freq: Four times a day (QID) | INTRAMUSCULAR | Status: DC | PRN
  Administered 2020-11-22 – 2020-11-23 (×2): 4 mg via INTRAVENOUS
  Filled 2020-11-22 (×2): qty 2

## 2020-11-22 MED ORDER — ENSURE ENLIVE PO LIQD
237.0000 mL | ORAL | Status: DC
  Administered 2020-11-23: 237 mL via ORAL

## 2020-11-22 MED ORDER — OXYCODONE HCL 5 MG PO TABS
5.0000 mg | ORAL_TABLET | ORAL | Status: DC | PRN
  Administered 2020-11-22 (×2): 10 mg via ORAL
  Administered 2020-11-23: 5 mg via ORAL
  Administered 2020-11-23 – 2020-11-24 (×2): 10 mg via ORAL
  Filled 2020-11-22 (×5): qty 2

## 2020-11-22 MED ORDER — MUPIROCIN 2 % EX OINT
1.0000 "application " | TOPICAL_OINTMENT | Freq: Two times a day (BID) | CUTANEOUS | Status: DC
  Administered 2020-11-22 – 2020-11-24 (×4): 1 via NASAL
  Filled 2020-11-22: qty 22

## 2020-11-22 MED ORDER — JUVEN PO PACK
1.0000 | PACK | Freq: Two times a day (BID) | ORAL | Status: DC
  Administered 2020-11-22 – 2020-11-23 (×3): 1 via ORAL
  Filled 2020-11-22 (×5): qty 1

## 2020-11-22 MED ORDER — MORPHINE SULFATE (PF) 2 MG/ML IV SOLN
2.0000 mg | INTRAVENOUS | Status: DC | PRN
  Administered 2020-11-22: 2 mg via INTRAVENOUS
  Filled 2020-11-22: qty 1

## 2020-11-22 NOTE — Progress Notes (Signed)
Initial Nutrition Assessment  DOCUMENTATION CODES:   Obesity unspecified  INTERVENTION:   -Juven Fruit Punch BID, each serving provides 95kcal and 2.5g of protein (amino acids glutamine and arginine)  -Ensure Enlive po daily, each supplement provides 350 kcal and 20 grams of protein  NUTRITION DIAGNOSIS:   Increased nutrient needs related to wound healing as evidenced by estimated needs.  GOAL:   Patient will meet greater than or equal to 90% of their needs  MONITOR:   PO intake, Supplement acceptance, Labs, Weight trends, I & O's, Skin  REASON FOR ASSESSMENT:   Malnutrition Screening Tool    ASSESSMENT:   35 y.o. female with a 5cm buttock abscess.  She has underwent I&D in ED.  We will admit for IV abx due to elevated wbc and systemic symptoms. Surgery monitoring for further debridement needs.  Patient having nausea today. Not eating much, drinking some liquids.  Pt reports weight loss of 10-15 lbs over the past month.  Per weight records, no recent weights available to verify. Per chart review, pt to be NPO tomorrow.  Recommend protein supplements to aid in wound healing.  Medications: IV Zofran   Labs reviewed.   NUTRITION - FOCUSED PHYSICAL EXAM:  No depletions noted.  Diet Order:   Diet Order             Diet NPO time specified  Diet effective midnight           Diet regular Room service appropriate? Yes; Fluid consistency: Thin  Diet effective now                   EDUCATION NEEDS:   No education needs have been identified at this time  Skin:  Skin Assessment: Skin Integrity Issues: Skin Integrity Issues:: Incisions Incisions: 7/21 rt buttocks  Last BM:     Height:   Ht Readings from Last 1 Encounters:  11/21/20 5' (1.524 m)    Weight:   Wt Readings from Last 1 Encounters:  11/21/20 83.5 kg    BMI:  Body mass index is 35.94 kg/m.  Estimated Nutritional Needs:   Kcal:  1600-1800  Protein:  60-70g  Fluid:   1.8L/day   Tilda Franco, MS, RD, LDN Inpatient Clinical Dietitian Contact information available via Amion

## 2020-11-22 NOTE — Progress Notes (Addendum)
Subjective: CC: Continues to have improving pain of the right buttocks.  Reports he did for sitz bath's yesterday.  Thinks the packing may be out.  Tolerated diet yesterday.  Able to mobilize.  Voiding.  No nausea or vomiting this morning.  Objective: Vital signs in last 24 hours: Temp:  [98.1 F (36.7 C)-98.9 F (37.2 C)] 98.9 F (37.2 C) (07/21 2105) Pulse Rate:  [60-74] 60 (07/21 2105) Resp:  [15-18] 15 (07/21 2105) BP: (99-110)/(55-76) 110/71 (07/21 2105) SpO2:  [98 %-100 %] 100 % (07/21 2105) Weight:  [83.5 kg] 83.5 kg (07/21 1341) Last BM Date:  (2 weeks ago per pt)  Intake/Output from previous day: 07/21 0701 - 07/22 0700 In: 1949.4 [P.O.:600; I.V.:667; IV Piggyback:682.4] Out: 640 [Urine:640] Intake/Output this shift: No intake/output data recorded.  PE: Gen:  Alert, NAD, pleasant HEENT: EOM's intact, pupils equal and round Card:  RRR Pulm:  CTAB, no W/R/R, effort normal Abd: Soft, ND, NT +BS GU: Chaperone present. The right buttock incision had slightly closed. I was able to bluntly open this with a sterile cotton swab with patients permission. This drained some purulent material. The wound is now open and draining. It measures ~3cm and can be probed ~3.5 - 4cm deep. The skin is macerated around the I&D site. There is ~7cm x ~7cm of erythema, heat and induration surrounding (lateral) the incision. This does not track towards the anus. No further area of fluctuance.  Ext:  No LE edema or calf tenderness Psych: A&Ox3  Skin: no rashes noted, warm and dry    Sterile cotton swab showing depth of ~3.5 - 4cm    Lab Results:  Recent Labs    11/21/20 0320 11/22/20 0402  WBC 21.2* 13.7*  HGB 11.2* 11.1*  HCT 34.3* 33.9*  PLT 360 360   BMET Recent Labs    11/20/20 1758 11/22/20 0402  NA 137 138  K 3.5 3.6  CL 101 108  CO2 26 26  GLUCOSE 101* 111*  BUN 14 7  CREATININE 1.08* 0.73  CALCIUM 9.4 8.5*   PT/INR No results for input(s): LABPROT, INR  in the last 72 hours. CMP     Component Value Date/Time   NA 138 11/22/2020 0402   K 3.6 11/22/2020 0402   CL 108 11/22/2020 0402   CO2 26 11/22/2020 0402   GLUCOSE 111 (H) 11/22/2020 0402   BUN 7 11/22/2020 0402   CREATININE 0.73 11/22/2020 0402   CALCIUM 8.5 (L) 11/22/2020 0402   PROT 6.1 (L) 11/22/2020 0402   ALBUMIN 2.9 (L) 11/22/2020 0402   AST 9 (L) 11/22/2020 0402   ALT 9 11/22/2020 0402   ALKPHOS 41 11/22/2020 0402   BILITOT 0.4 11/22/2020 0402   GFRNONAA >60 11/22/2020 0402   GFRAA >60 04/25/2016 1330   Lipase     Component Value Date/Time   LIPASE 26 03/20/2015 0339    Studies/Results: CT PELVIS W CONTRAST  Result Date: 11/20/2020 CLINICAL DATA:  Anorectal abscess. Patient reports worsening over the last 2 weeks. EXAM: CT PELVIS WITH CONTRAST TECHNIQUE: Multidetector CT imaging of the pelvis was performed using the standard protocol following the bolus administration of intravenous contrast. Patient was scanned prone CONTRAST:  90mL OMNIPAQUE IOHEXOL 350 MG/ML SOLN COMPARISON:  None. FINDINGS: Urinary Tract: Decompressed distal ureters. Unremarkable urinary bladder. Bowel: Mild wall thickening in the in anorectum with questionable but not definitive tract to the gluteal abscess, for example series 2, image 42. Pelvic bowel loops are  otherwise unremarkable Vascular/Lymphatic: Multiple prominent right inguinal lymph nodes are likely reactive. No acute vascular findings. Reproductive: IUD in the uterus which is deviated to the right pelvis. No adnexal mass. Other: Inflammatory changes in the right perineum and along the right gluteal crease with air-fluid collection. Subcutaneous collections spans approximately 5.4 x 4.4 x 5.9 cm, series 2, image 49. There is surrounding fat stranding, inflammation and foci of gas extending cranially towards the right aspect of the anorectum. Possible but not definite tract extending to the right in the rectum, series 2, image 42. There is skin  thickening adjacent to the collection is well as involving the dependent right buttock soft tissues. No air tracks elsewhere. No additional pelvic fluid collection. Musculoskeletal: There are no acute or suspicious osseous abnormalities. Bone island in the right iliac. IMPRESSION: 1. Right gluteal/perineal subcutaneous abscess measuring 5.4 x 4.4 x 5.9 cm. There is surrounding fat stranding, inflammation and foci of gas extending cranially towards the right aspect of the anorectum. Possible but not definite tract extending to the right aspect of the rectum. 2. Mild rectal wall thickening. 3. Prominent right inguinal lymph nodes are likely reactive. Electronically Signed   By: Narda Rutherford M.D.   On: 11/20/2020 19:51    Anti-infectives: Anti-infectives (From admission, onward)    Start     Dose/Rate Route Frequency Ordered Stop   11/21/20 1000  ciprofloxacin (CIPRO) IVPB 400 mg        400 mg 200 mL/hr over 60 Minutes Intravenous Every 12 hours 11/21/20 0229     11/21/20 0219  ciprofloxacin (CIPRO) IVPB 400 mg  Status:  Discontinued        400 mg 200 mL/hr over 60 Minutes Intravenous Every 12 hours 11/21/20 0219 11/21/20 0228   11/21/20 0219  metroNIDAZOLE (FLAGYL) IVPB 500 mg        500 mg 100 mL/hr over 60 Minutes Intravenous Every 8 hours 11/21/20 0219     11/20/20 2015  ciprofloxacin (CIPRO) IVPB 400 mg        400 mg 200 mL/hr over 60 Minutes Intravenous  Once 11/20/20 2009 11/20/20 2259   11/20/20 2015  metroNIDAZOLE (FLAGYL) IVPB 500 mg        500 mg 100 mL/hr over 60 Minutes Intravenous  Once 11/20/20 2009 11/20/20 2142        Assessment/Plan Right buttock abscess with cellulitis  - S/p I&D in ED on 7/20 by EDP - Wound open and draining. Cont abx. No indication for surgery today - After discussion with MD, will plan to pack the area and monitor overnight on IV abx to ensure she is improving in AM - Cont sitz baths, patient may shower - Cont abx. No cx's obtained during  procedure  FEN - Reg VTE - SCDs, Loveonx ID - Cipro/Flagyl   LOS: 1 day    Jacinto Halim , [facility] 11/22/2020, 8:58 AM Please see Amion for pager number during day hours 7:00am-4:30pm

## 2020-11-23 NOTE — Progress Notes (Signed)
   Subjective/Chief Complaint: Reports less pain   Objective: Vital signs in last 24 hours: Temp:  [98.6 F (37 C)-98.9 F (37.2 C)] 98.6 F (37 C) (07/23 0607) Pulse Rate:  [54-63] 63 (07/23 0607) Resp:  [16-18] 16 (07/23 0607) BP: (109-114)/(49-56) 114/52 (07/23 0607) SpO2:  [99 %-100 %] 99 % (07/23 0607) Last BM Date:  (2 weeks ago per pt)  Intake/Output from previous day: 07/22 0701 - 07/23 0700 In: 1368.3 [P.O.:240; I.V.:428.3; IV Piggyback:700] Out: 800 [Urine:800] Intake/Output this shift: No intake/output data recorded.  Exam: Awake and alert Wound with less induration, erythema  Lab Results:  Recent Labs    11/21/20 0320 11/22/20 0402  WBC 21.2* 13.7*  HGB 11.2* 11.1*  HCT 34.3* 33.9*  PLT 360 360   BMET Recent Labs    11/20/20 1758 11/22/20 0402  NA 137 138  K 3.5 3.6  CL 101 108  CO2 26 26  GLUCOSE 101* 111*  BUN 14 7  CREATININE 1.08* 0.73  CALCIUM 9.4 8.5*   PT/INR No results for input(s): LABPROT, INR in the last 72 hours. ABG No results for input(s): PHART, HCO3 in the last 72 hours.  Invalid input(s): PCO2, PO2  Studies/Results: No results found.  Anti-infectives: Anti-infectives (From admission, onward)    Start     Dose/Rate Route Frequency Ordered Stop   11/21/20 1000  ciprofloxacin (CIPRO) IVPB 400 mg        400 mg 200 mL/hr over 60 Minutes Intravenous Every 12 hours 11/21/20 0229     11/21/20 0219  ciprofloxacin (CIPRO) IVPB 400 mg  Status:  Discontinued        400 mg 200 mL/hr over 60 Minutes Intravenous Every 12 hours 11/21/20 0219 11/21/20 0228   11/21/20 0219  metroNIDAZOLE (FLAGYL) IVPB 500 mg        500 mg 100 mL/hr over 60 Minutes Intravenous Every 8 hours 11/21/20 0219     11/20/20 2015  ciprofloxacin (CIPRO) IVPB 400 mg        400 mg 200 mL/hr over 60 Minutes Intravenous  Once 11/20/20 2009 11/20/20 2259   11/20/20 2015  metroNIDAZOLE (FLAGYL) IVPB 500 mg        500 mg 100 mL/hr over 60 Minutes Intravenous   Once 11/20/20 2009 11/20/20 2142       Assessment/Plan: Buttock abscess  Improving Continue wound care and antibiotics  LOS: 2 days    Kathleen Mathews 11/23/2020

## 2020-11-24 MED ORDER — METRONIDAZOLE 500 MG PO TABS: 500.0000 mg | ORAL_TABLET | Freq: Three times a day (TID) | ORAL | 0 refills | Status: AC

## 2020-11-24 MED ORDER — OXYCODONE HCL 5 MG PO TABS: 5.0000 mg | ORAL_TABLET | Freq: Four times a day (QID) | ORAL | 0 refills | Status: AC | PRN

## 2020-11-24 MED ORDER — CIPROFLOXACIN HCL 500 MG PO TABS: 500.0000 mg | ORAL_TABLET | Freq: Two times a day (BID) | ORAL | 0 refills | Status: AC

## 2020-11-24 NOTE — Progress Notes (Signed)
   Subjective/Chief Complaint: No complaints. Sister is Rn and will do dressing changes   Objective: Vital signs in last 24 hours: Temp:  [98.4 F (36.9 C)-98.5 F (36.9 C)] 98.4 F (36.9 C) (07/24 0559) Pulse Rate:  [56-67] 67 (07/24 0559) Resp:  [14-18] 18 (07/24 0559) BP: (95-126)/(48-56) 126/48 (07/24 0559) SpO2:  [99 %-100 %] 99 % (07/24 0559) Last BM Date:  (2 weeks ago per pt)  Intake/Output from previous day: 07/23 0701 - 07/24 0700 In: 1592 [P.O.:480; I.V.:596.4; IV Piggyback:515.6] Out: 2900 [Urine:2900] Intake/Output this shift: No intake/output data recorded.  General appearance: alert and cooperative Resp: clear to auscultation bilaterally Cardio: regular rate and rhythm GI: soft, nontender. Right buttock wound cleaning up  Lab Results:  Recent Labs    11/22/20 0402  WBC 13.7*  HGB 11.1*  HCT 33.9*  PLT 360   BMET Recent Labs    11/22/20 0402  NA 138  K 3.6  CL 108  CO2 26  GLUCOSE 111*  BUN 7  CREATININE 0.73  CALCIUM 8.5*   PT/INR No results for input(s): LABPROT, INR in the last 72 hours. ABG No results for input(s): PHART, HCO3 in the last 72 hours.  Invalid input(s): PCO2, PO2  Studies/Results: No results found.  Anti-infectives: Anti-infectives (From admission, onward)    Start     Dose/Rate Route Frequency Ordered Stop   11/21/20 1000  ciprofloxacin (CIPRO) IVPB 400 mg        400 mg 200 mL/hr over 60 Minutes Intravenous Every 12 hours 11/21/20 0229     11/21/20 0219  ciprofloxacin (CIPRO) IVPB 400 mg  Status:  Discontinued        400 mg 200 mL/hr over 60 Minutes Intravenous Every 12 hours 11/21/20 0219 11/21/20 0228   11/21/20 0219  metroNIDAZOLE (FLAGYL) IVPB 500 mg        500 mg 100 mL/hr over 60 Minutes Intravenous Every 8 hours 11/21/20 0219     11/20/20 2015  ciprofloxacin (CIPRO) IVPB 400 mg        400 mg 200 mL/hr over 60 Minutes Intravenous  Once 11/20/20 2009 11/20/20 2259   11/20/20 2015  metroNIDAZOLE  (FLAGYL) IVPB 500 mg        500 mg 100 mL/hr over 60 Minutes Intravenous  Once 11/20/20 2009 11/20/20 2142       Assessment/Plan: s/p * No surgery found * Advance diet Discharge Switch to oral abx Dressing changes at home  LOS: 3 days    Chevis Pretty III 11/24/2020

## 2020-11-24 NOTE — Plan of Care (Signed)
Reviewed written d/c instructions w pt and her mother, all questions answered and they both verbalized understanding. Instructed mother on dsg change (allowed her to watch me change the dsg) and she verbalized understanding. D/C ambulatory w all belongings in stable condition.

## 2020-12-03 NOTE — Discharge Summary (Signed)
Patient ID: Kathleen Mathews [id number] Jun 18, 1985 35 y.o.  Admit date: 11/20/2020 Discharge date: 11/24/2020  Admitting Diagnosis: Buttock abscess  Discharge Diagnosis Patient Active Problem List   Diagnosis Date Noted   Abscess of buttock 11/21/2020   Rectal abscess 11/21/2020    Consultants None  Reason for Admission: Kathleen Mathews is an 35 y.o. female who is here for buttock pain.  No significant past medical history.  She states she had a pimple in her perirectal area that came up about 2 weeks ago.  She said it ruptured and was doing better and then it started to recur again.  She has been trying warm compresses without relief.  She has had on and off fevers and some nausea.  No prior history of same.  Procedures I&D by [facility] 7/20  Hospital Course:  Patient was admitted for right buttock abscess with cellulitis. She underwent I&D in ED on 7/20 by EDP. She was placed on abx and admitted to the general surgery service. The area was monitored and did not feel to need any further surgical intervention. WBC improved. On 7/24 she was changed to oral abx and felt stable for d/c. He sister is to dressing changes at home. A follow up appointment had been arranged prior to d/c.   At the time of discharge I was not directly involved in this patient's care and did not see the patient on day of discharge, therefore some of the information in this discharge summary was taken entirely from the chart. Please see MD's note on day of discharge for more information and physical exam.    Allergies as of 11/24/2020       Reactions   Latex Rash        Medication List     TAKE these medications    ARIPiprazole 10 MG tablet Commonly known as: ABILIFY Take 10 mg by mouth daily.   ciprofloxacin 500 MG tablet Commonly known as: Cipro Take 1 tablet (500 mg total) by mouth 2 (two) times daily for 10 days.   hydrocortisone 2.5 % rectal cream Commonly known as:  ANUSOL-HC Place 1 application rectally 4 (four) times daily as needed (anal irritation).   hydrOXYzine 25 MG capsule Commonly known as: VISTARIL Take 25 mg by mouth 4 (four) times daily as needed for anxiety.   metroNIDAZOLE 500 MG tablet Commonly known as: Flagyl Take 1 tablet (500 mg total) by mouth 3 (three) times daily for 14 days.   naproxen sodium 220 MG tablet Commonly known as: ALEVE Take 440 mg by mouth 2 (two) times daily as needed (pain/headache/fever).   oxyCODONE 5 MG immediate release tablet Commonly known as: Oxy IR/ROXICODONE Take 1-2 tablets (5-10 mg total) by mouth every 6 (six) hours as needed for moderate pain or severe pain (5mg  for moderate pain, 10mg  for severe pain).   sertraline 100 MG tablet Commonly known as: ZOLOFT Take 150 mg by mouth daily.   traZODone 150 MG tablet Commonly known as: DESYREL Take 150 mg by mouth at bedtime as needed for sleep.          Follow-up Information     [facility] up on 12/03/2020.   Specialty: General Surgery Why: your appointment is at 10:45AM.  Be at the office 15 minutes early for check in.  Bring Photo ID and insurance information. Contact information: [address] [place] 02/02/2021 [place] [phone]  Signed: Leary Roca, [facility] 12/03/2020, 10:08 AM Please see Amion for pager number during day hours 7:00am-4:30pm
